# Patient Record
Sex: Male | Born: 1950 | Race: White | Hispanic: No | Marital: Married | Smoking: Former smoker
Health system: Southern US, Academic
[De-identification: ages and names within clinical notes are randomized; demographics above are authoritative.]

## PROBLEM LIST (undated history)

## (undated) DIAGNOSIS — E785 Hyperlipidemia, unspecified: Secondary | ICD-10-CM

## (undated) DIAGNOSIS — M545 Low back pain, unspecified: Secondary | ICD-10-CM

## (undated) DIAGNOSIS — Z87898 Personal history of other specified conditions: Secondary | ICD-10-CM

## (undated) DIAGNOSIS — R202 Paresthesia of skin: Secondary | ICD-10-CM

## (undated) DIAGNOSIS — I10 Essential (primary) hypertension: Secondary | ICD-10-CM

## (undated) DIAGNOSIS — E119 Type 2 diabetes mellitus without complications: Secondary | ICD-10-CM

## (undated) HISTORY — DX: Paresthesia of skin: R20.2

## (undated) HISTORY — DX: Personal history of other specified conditions: Z87.898

## (undated) HISTORY — DX: Essential (primary) hypertension: I10

## (undated) HISTORY — DX: Low back pain, unspecified: M54.50

## (undated) HISTORY — DX: Hyperlipidemia, unspecified: E78.5

## (undated) HISTORY — PX: HX EYE SURGERY: 2100001143

## (undated) HISTORY — PX: HX HAND SURGERY: 2100001299

## (undated) HISTORY — DX: Type 2 diabetes mellitus without complications (CMS HCC): E11.9

## (undated) HISTORY — PX: HX WRIST FRACTURE TX: SHX121

## (undated) HISTORY — PX: EYE SURGERY: SHX253

## (undated) HISTORY — PX: HAND SURGERY: SHX662

## (undated) NOTE — ED Notes (Signed)
Formatting of this note might be different from the original.  Pt updated on Xray results, pending blood results, awaiting urine sample.  Electronically signed by Sidonie Dickens, RN at 05/17/2017  1:51 PM EDT

## (undated) NOTE — ED Notes (Signed)
Formatting of this note might be different from the original.  Bed: 17H  Expected date:   Expected time:   Means of arrival:   Comments:  Triage  Electronically signed by Ocie Bob, RN at 05/17/2017 12:06 PM EDT

## (undated) NOTE — ED Provider Notes (Signed)
Formatting of this note is different from the original.  This patient was presented to Dr. Evelene Croon by Dr. Mayford Knife.    Chief Complaint   Patient presents with   ? Fatigue     "slept for 24 hours"; endorses worsening weakness x 2 weeks, recently treated for right ear infection on 2nd abx, dizzy     HPI   Louis Harper is a 39 y.o. 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 Louis Harper reports 2 weeks of dizziness, weakness, and fatigue. He describes the dizziness as the feeling "when you stand up too quickly." Dizziness is constant and nothing makes it better or worse. It is unchanged with positional changes. He has endorsed associated decreased PO intake. He denies recent falls, nausea, emesis, diarrhea, one sided weakness, or vision changes. Patient reports history of vertigo many years ago which was improved with a manipulation, however this has not been an issue since. Wife also reports Louis Harper required admission for paroxysmal atrial flutter at an OSH in [place] in November 2018 at which time he spent a night in the ICU.     Patient was seen OSH recently for right otitis media ~2 weeks ago for which he was placed on amoxicillin. He developed 2 days of fever (Tmax 101-102F, last fever 3/23) and was seen again at a [facility], where he was switched to Augmentin with concern for sinusitis (been on since 3/20). He reports some right sided phonophobia but denies cough.     Louis Harper metformin was recently increased in the first week of March but he denies other medication changes. He denies use of alcohol, drugs, or any smoking since 2010. NKDA.    Review of Systems   Constitutional: Positive for appetite change (decreased), fatigue and fever (resolved).   HENT:        Positive for R phonophobia   Eyes: Negative for visual disturbance.   Respiratory: Negative for cough.    Gastrointestinal: Negative for  diarrhea, nausea and vomiting.   Neurological: Positive for dizziness and weakness.   All other systems reviewed and are negative.    History  Past Medical History:   Diagnosis Date   ? Club foot    ? DM (diabetes mellitus)    ? HLD (hyperlipidemia)    ? HTN (hypertension)    ? Old MI (myocardial infarction)    ? Unspecified atrial flutter      Past Surgical History:   Procedure Laterality Date   ? CARDIAC CATHETERIZATION     ? WRIST SURGERY Right      No family history on file.  Social History     Socioeconomic History   ? Marital status: Married     Spouse name: Not on file   ? Number of children: Not on file   ? Years of education: Not on file   ? Highest education level: Not on file   Occupational History   ? Not on file   Social Needs   ? Financial resource strain: Not on file   ? Food insecurity:     Worry: Not on file     Inability: Not on file   ? Transportation needs:     Medical: Not on file     Non-medical: Not on file   Tobacco Use   ? Smoking status: Not on file   Substance and Sexual Activity   ?  Alcohol use: Not on file   ? Drug use: Not on file   ? Sexual activity: Not on file   Lifestyle   ? Physical activity:     Days per week: Not on file     Minutes per session: Not on file   ? Stress: Not on file   Relationships   ? Social connections:     Talks on phone: Not on file     Gets together: Not on file     Attends religious service: Not on file     Active member of club or organization: Not on file     Attends meetings of clubs or organizations: Not on file     Relationship status: Not on file   ? Intimate partner violence:     Fear of current or ex partner: Not on file     Emotionally abused: Not on file     Physically abused: Not on file     Forced sexual activity: Not on file   Other Topics Concern   ? Not on file   Social History Narrative   ? Not on file     Prior to Admission medications    Not on File     No Known Allergies    Physical Exam   Vitals:    05/17/17 1213 05/17/17 1513   BP: (!)  145/68 133/71   Pulse: 85 73   Resp: 16 18   Temp: 36.4 C (97.5 F)    TempSrc: Oral    SpO2: 99% 97%     Nursing notes and vital signs reviewed    General: Patient in no acute distress, overall appears comfortable  Head: Normocephalic, atraumatic  Eyes: No scleral icterus, PERRL, EOMI  Ears/Nose/Throat: Moist mucous membranes, airway is patent, no facial swelling, TM visualized clear, w/o evidence of infection.  Cardiovascular: Regular rate and rhythm with no murmurs/rubs appreciated, 2+ radial pulses bilaterally, no LE swelling, no JVD appreciated  Pulmonary: Normal respiratory effort, lungs w/o wheezes/crackles  GI: Abdomen is obese but soft, non-distended, and non-tender. Peritoneal signs are absent.   Back: No gross deformities.  Musculoskeletal: No gross injuries or deformities.   Neurological: Alert and oriented x3, face symmetric, hearing intact to voice, speech normal, bilateral hand tremors, no facial asymmetry, normal v1-v3 sensation bilaterally, no nystagmus, tongue midline, equal shoulder shrug, difficulty with finger to nose, no dysmetria, positive romberg  Psych: Behaving appropriately   Skin: Warm, dry, no obvious rashes    Procedures    MDM    Adult ECG Report on May 17, 2017 at 12:02:49     Rate: 94   PR Interval: 161   QRS Duration: 93   QTc: 452   QRS Axis: 268   Narrative Interpretation: No STEMI. Q's II, III, F    Labs Reviewed   COMPREHENSIVE METABOLIC PANEL - Abnormal; Notable for the following components:       Result Value    Glucose 131 (*)     est GFR 56 (*)     All other components within normal limits   URINALYSIS WITH REFLEX MICROSCOPIC - Abnormal; Notable for the following components:    Glucose UA 1+ (*)     Cast Urine Few (*)     All other components within normal limits   POCT GLUCOSE - Abnormal; Notable for the following components:    Poc Glucose 140 (*)     All other components within normal limits   MAGNESIUM -  Normal   TROPONIN I - Normal   CBC AND PLT     X-Ray Chest 2  views   Final Result   IMPRESSION:     No acute cardiopulmonary process.     Arlington Calix,  M.D.   [facility], Attending Physician       Medications   sodium chloride 0.9% IV bolus 500 mL (0 mL Intravenous Completed 05/17/17 1415)     ED Course:     ED Course as of May 18 1606   Wed May 17, 2017   1305 Concern for dehydration, electrolyte abnormality, ACS, vertigo.  VSS at presentation and remained so throughout ED course.  EKG NSR with q waves in inferior leads.    POCT glucose 140  [CW]   1400 CXR: No acute cardiopulmonary process  [CW]   1400 WBC 9  glucose 131  Magnesium 2.2  Troponin <0.02  UA negative  [CW]   1506 Patient safe for discharge, weakness fatigue may be attributed to possible dehydration, stress of ill family member.  Patient instructed to follow up with PCP.  Return precautions given.  [CW]     ED Course User Index  [CW] Bufford Lope, MD     Attestations:    I have personally interviewed and examined the patient.  I have reviewed the resident's history, physical exam, assessment and management plans.  I concur with or have edited all elements of the resident's note.  Myles Rosenthal, MD, May 17, 2017, 16:22    The documentation recorded by the scribe accurately reflects the service I personally performed and the decisions made by me.   Bufford Lope, MD, May 17, 2017, 14:39    Dr. Mayford Knife obtained and performed the history, physical exam and medical decision making elements that were entered into the chart by me, Mayford Knife, scribe [phone]).  May 17, 2017  12:55    EKG was entered into the chart by me, Bryon Lions, scribe [phone]).  May 17, 2017  13:32    Electronically Signed By:      Bufford Lope, MD  Resident  05/17/17 1608      Myles Rosenthal, MD  05/17/17 1622    Electronically signed by Myles Rosenthal, MD at 05/17/2017  4:22 PM EDT

## (undated) NOTE — ED Notes (Signed)
Formatting of this note might be different from the original.  Pt reassessed, agree with prior assessment. Pt able to walk to bathroom with minimal assistance. Updated on course of care. Will continue to monitor.  Electronically signed by Sidonie Dickens, RN at 05/17/2017  1:35 PM EDT

---
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
## 2000-11-07 ENCOUNTER — Other Ambulatory Visit (HOSPITAL_COMMUNITY): Payer: Self-pay

## 2014-01-21 ENCOUNTER — Encounter (HOSPITAL_COMMUNITY): Payer: Self-pay

## 2014-01-21 NOTE — Ancillary Notes (Addendum)
[facility] Record for: Louis Harper, [name]  Created: 01/21/2014 10:22:45 AM  MRN: [id number]  DOB: 26-Jan-1951  SSN: [id number]  Sex: Male  Height: 6 Feet 0 Inches - Weight: 242  Maiden Name:   Address:   [address]  [place]: [place], [place] [id number]  E-Mail:   Day Phone: [phone] Night Phone: [phone] Other Phone: [phone]  Phone comments:   Call Back time:   Authorized contact: Wynema BirchBillie Howeth-wife  Intake Date: 01/21/2014 10:22:45 AM  PCP: Lelon PerlaSaunders FNP-BC, Deanna ArtisKeisha   RefMD: Lacy DuverneySaunders FNP-BC, Deanna ArtisKeisha   Primary Insurance: Medicare  Secondary Insurance: BCBS PPO  Insurance Comments: [phone]---card on file     -------Referral Assignment------  Where was the original referral directed?: Physician Practice  Was a specific reviewer requested?: Unassigned Referral  How was the reviewer selected?: Rotation  Who requested the specific reviewer?:   How did you hear of our spine program?:   Is this a second opinion?:      -------Web Challenge Info----------  Favorite Color:   City of Birth:      ---------- 1st Review ----------     Review Date: 02/08/2014  Review Completed by: Neta EhlersScott Daffner  Impression: Low Back Pain  Disposition: Appointment/Treatment with Colleague  Appt w/ Colleague: First Available Appointment  Colleague Name: Non Surgical Provider  Appt How Soon:   Pre Treatment Type: Physical Therapy; Facet Injection  Pre Treatment Type Details:   Pre Treatment Other Test:   Appt Type:   Instructions: Back pain. Has facet arthrosis multiple levels. No obvious stenosis. Start PT, use NSAIDs as tolerated, can f/u with nonop provider. Might benefit from facet blocks/ablation. Looks like he[place] from [place] so may also be able to find nonop provider closer to home. Avoid narcotics     ---------- Symptoms ----------     Chief complaint: low back painno/hip/buttock/leg/groin pain  Diagnosis from Other MD:      Symptoms: Pain  Other Symptom Description:   Pain Location: Low back  Other Pain Location Description:    Where is your pain the worst?:   Pain Type: Sharp/Stabbing,Intermittent  Pain Rating: 10  Does the pain radiate to other parts of your body? No   Radiate Where:   Other Description:   Does it radiate to the fingers?   Does it radiate below the elbow?   Which specific part of your arm?   Which fingers?   Which part of your arm does pain go to?   How does it radiate to the arm?   Does it radiate to the toes?   Does it radiate below the knee?   Which specific part of your leg?   Which toes?   Which part of your leg does pain go to?   How does it radiate to the leg?   Additional pain information When back is at      Location of Numbness/tingling:   Other Description:  Does numbness/tingling radiate to other parts of your body?:  Where does the numbess/tingling radiate?:  Other Description:  Does the numbness/tingling radiate below your elbow?:  Which specific part of your arm?:  Which fingers:  Which pat of your arm does the numbness/tingling go to?:  How does the numbness/tingling radiate to your arm?:  Does the numbness/tingling radiate below your knee?:  Which specific part of your leg?:  Which toes:  Which part of your leg does the numbness/tingling go to?:  How does the numbness/tingling radiate to your leg?:  Additional Numbness/tingling information:  Other Description:      Location of Weakness:   Other Description:   Additional Weakness Information:      The symptoms have been present for: more than 1 year  The symptoms began: Hurt his back over 20 years ago --- on and off pain since then---getting worse  Was there a specific event that caused your symptoms?: As a result of an injury at work  Additional Narrative Description:   Are you able to perform your daily activities with these symptoms?:   Since what date have you been unable to perform your daily routine?:   The symptoms improve when you: Never improve  Other activities that improve your symptoms:   The symptoms worsen when you: Activity  Other  activities that worsen your symptoms:      ---------- Work History ----------     Are you able to work?: Does not apply  Reasons for not working: Retired  Other Reason for not working:   Do you have Work Restrictions:   If applicable, maximum lifting restriction:   How long have you been unable to work:   Have you ever filed a W/C claim related to a neck or back injury?: yes - back  Occupation:   Other Occupation Information:      ---------- Bowel/Bladder/Incontinence Issues ----------     Since the onset of symptoms, have you experienced any new problems urinating or having bowel movements?: No  Description:   Other Description:   How long have you had these bowel/bladder problems?:      ---------- Allergies ----------     Do you have any medication allergies? NKA  Allergies:   Other Details:   Allergic to Latex?: No  Allergic to intravenous contrast dyes?: NKA  Allergic to steroids?: No     ---------- Treatment/Testing ----------      Taking prescription medication for this problem?: Yes  Are you using any now?: Yes  Medication: couldn't remember what he takes; MD Name: ; Date Prescribed: ; Dosage: ; Times/Day:   Have you received a Medrol dose pack for this problem?: No  When did you last take the dosepack?:   Were your symptoms improved?:   Would you describe your relief as:   How long did you experience that amount of relief?:   Other Medrol dosepack information:      --------------- PT ---------------     PT: No  When Received:   Where Received:   Other where received information:   Visits:   Types:   Other Types:   Improved:      If improved, describe level of relief:   If improved, how long did you experience relief:   Other Physical Therapy Treatment Information:      ---------- Chiropractic Services ----------     Chiro: No  When:   Who:   Visits:   Types:   Other Types:   Were Symptoms Improved:   If improved, describe level of relief:   If improved, how long did you experience relief:   Other  Chiropractic Treatment Information:      --------------- ESI ---------------     ESI: No  When:   Who:   Visits:   Types:   Improved:   If improved, describe level of relief:   If improved, how long did you experience relief:   Other Injection Treatment Information:      ---------- Diagnostic Tests ----------     Plain spine X-rays On:11-22-13 Where: [facility]  Area(s) Scanned: Lumbar  CT scan On:01-15-14 Where: [facility]  Area Scanned: Lumbar  Do you have any of the following in case an MRI is ordered?: None  Other MRI factors:      ---------- Past Medical History ----------     What other doctors/providers have treated you for these spine issues? Lacy DuverneySaunders FNP-BC, DentonKeisha Specialty: Primary Care Date: 11/15    Ever diagnosed with Spine deformity?:   Prior neck or back surgery (1)?: No  When:   Who:   Area of neck/spine operated on:   Level:   Were symptoms improved?:   If improved, describe level of relief:  If improved, how long did you experience relief?:   Prior neck or back surgery (2)?:   When:   Who:   Area of neck/spine operated on:   Level:   Were symptoms improved?:   If improved, describe level of relief:  If improved, how long did you experience relief?:   Prior neck or back surgery (3)?:   When:   Who:   Area of neck/spine operated on:   Level:   Were symptoms improved?:   If improved, describe level of relief:  If improved, how long did you experience relief?:   Do you have any of the following assistive devices? None  How long have you required these assistive devices?   Currently being treated for any other medical condition?: Yes  Conditions: Couldn't remember will bring list  Other Conditions:   Type of neurologic disorder:   Cancer Type:   Other Treatment/Medication: Will bring list  What blood thinners are you currently taking?: None  Which physicians are treating you for your other medical conditions?: Lelon PerlaSaunders FNP-BC,Keisha  Do you smoke?: No  Are you pre-menopausal or  post-menopausal?:   If recommended, are you willing to consider surgery?:   What is your goal in seeking treatment?:   Other pertinent information/ general comments/ goals for treatment: no hx cano other studies     ---------- Care Coordinator Information ----------     Care Coordinator:   Activity Log: 02/10/2014 9:28:46 AM Misty Stanley[Lisa Herod]: Phone call to patient. Reviewed patient's medical history.  Patient states no change in symptoms.  Discussed MD initial impression and recommendations to start PT, use NSAIDs as tolerated, can f/u with nonop provider and avoid narcotics.  Provided education on facet arthrosis multiple levels and no obvious stenosis. Patient chose to accept recommendations.  Instructed to follow-up with PCP/referring MD for therapy script due to insurance restrictions.  Instructed Referral Specialist will call to schedule appointment/tests.  Explained that a letter will be faxed to the PCP/referring physician regarding this conversation. Patient verbalized understanding. Marcelina MorelLisa Herod, RN     Letter/Test Coordinator: Letters  Letter/Test Coordinator Log: 02/10/2014 9:53:54 AM Elnita Maxwell[Cheryl Chidester]: Faxed treatment letter patient summary and physical therapy script to Andi DevonKeisha Saunders FNP-BC. CChidester. 02/10/2014 10:29:09 AM Rosey Bath[Teresa Benson]: Called and scheduled appointment for patient to see Dr. Liliane BadeSully on 03/27/13 at [facility]1pm. TBenson     Intake Specialist Comments:      Clinic Staff Comments:      Last Edited by: Marney DoctorLori Manu Rubey on 05/06/2014 10:52:58 AM  Last Review by: Neta EhlersScott Daffner on 02/08/2014

## 2014-03-27 ENCOUNTER — Ambulatory Visit (INDEPENDENT_AMBULATORY_CARE_PROVIDER_SITE_OTHER): Payer: Self-pay | Admitting: Orthopaedics-Spine Service Non-Operative

## 2014-04-03 ENCOUNTER — Ambulatory Visit (INDEPENDENT_AMBULATORY_CARE_PROVIDER_SITE_OTHER): Payer: Self-pay | Admitting: Orthopaedics-Spine Service Non-Operative

## 2014-05-06 ENCOUNTER — Ambulatory Visit
Payer: Medicare Other | Attending: Orthopaedics-Spine Service Non-Operative | Admitting: Orthopaedics-Spine Service Non-Operative

## 2014-05-06 ENCOUNTER — Ambulatory Visit (HOSPITAL_BASED_OUTPATIENT_CLINIC_OR_DEPARTMENT_OTHER): Payer: Medicare Other

## 2014-05-06 ENCOUNTER — Encounter (INDEPENDENT_AMBULATORY_CARE_PROVIDER_SITE_OTHER): Payer: Self-pay | Admitting: Orthopaedics-Spine Service Non-Operative

## 2014-05-06 VITALS — BP 146/83 | HR 84 | Temp 96.2°F | Ht 72.0 in | Wt 256.4 lb

## 2014-05-06 DIAGNOSIS — M545 Low back pain, unspecified: Secondary | ICD-10-CM

## 2014-05-06 DIAGNOSIS — Z87891 Personal history of nicotine dependence: Secondary | ICD-10-CM | POA: Insufficient documentation

## 2014-05-06 DIAGNOSIS — M47896 Other spondylosis, lumbar region: Secondary | ICD-10-CM

## 2014-05-06 DIAGNOSIS — M129 Arthropathy, unspecified: Secondary | ICD-10-CM

## 2014-05-06 DIAGNOSIS — M5136 Other intervertebral disc degeneration, lumbar region: Secondary | ICD-10-CM

## 2014-05-06 MED ORDER — DICLOFENAC SODIUM 75 MG TABLET,DELAYED RELEASE
75.0000 mg | DELAYED_RELEASE_TABLET | Freq: Every day | ORAL | Status: DC
Start: 2014-05-06 — End: 2014-05-26

## 2014-05-06 NOTE — Progress Notes (Signed)
[facility]        Patient Name:  Louis Harper   Date of Birth:  07-15-1950   MRN#:  [id number]  Date of Service:  05/06/2014         WUJ:WJXBJY Saunders, FNP  [facility] [address]  [place] [address]  Referring:Saunders, Deanna Artis, FNP  [facility]  [address]  [place], [place] [address] , thank you for this request for consultation.    Chief Complaint   Patient presents with    Back Pain       History of Present Illness: Louis Harper  is a 64 y.o. male who presents with a complaint of LBP.      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    Pain is located at low back without radiation .   The pain is described as achy in character, 3/10 in severity most of the time but increases to 10/10 several times a week  Exacerbated with lifting, walking, .  Alleviated with hot water, tens unit.    There is no associated numbness or tingling  There is no weakness.  There is no new bowel/bladder symptoms    Prior Treatments:  -Physical Therapy? Did several years ago  -Prior injections? none  -Medications? Hydrocodone 5/325mg  one pill only      Work: retired in 1995    Past Medical History   Diagnosis Date    Low back pain            Past Surgical History   Procedure Laterality Date    Hx eye surgery       right "lazy eye"    Hx hand surgery       right    Hx wrist fracture tx       right           Outpatient Prescriptions Marked as Taking for the 05/06/14 encounter (Office Visit) with Brenton Grills, MD   Medication Sig    allopurinol (ZYLOPRIM) 100 mg Oral Tablet Take 100 mg by mouth Once a day    diclofenac sodium (VOLTAREN) 75 mg Oral Tablet, Delayed Release (E.C.) Take 1 Tab (75 mg total) by mouth Once a day    FOLIC ACID/MULTIVIT-MIN/LUTEIN (CENTRUM SILVER ORAL) Take by mouth    hydrochlorothiazide (HYDRODIURIL) 25 mg Oral Tablet Take 25 mg by mouth Once a day    lisinopril (PRINIVIL) 10 mg Oral Tablet Take 10 mg by mouth Once a day     metFORMIN (GLUCOPHAGE) 500 mg Oral Tablet Take 500 mg by mouth Twice daily with food    omeprazole (PRILOSEC) 20 mg Oral Capsule, Delayed Release(E.C.) Take 20 mg by mouth Twice daily    pravastatin (PRAVACHOL) 40 mg Oral Tablet Take 40 mg by mouth Every evening       Allergy History as of 05/06/14      No Known Allergies                No family history on file.        Otherwise, PMH, PSH, and family history for Spine is negative.  History     Social History    Marital Status: Married     Spouse Name: N/A     Number of Children: N/A    Years of Education: N/A     Occupational History    Not on file.  Social History Main Topics    Smoking status: Former Smoker     Quit date: 05/05/2008    Smokeless tobacco: Not on file      Comment: hx 2-3 PPD x 40 years    Alcohol Use: No      Comment: quit 2010    Drug Use: No    Sexual Activity: Not on file     Other Topics Concern    Not on file     Social History Narrative    No narrative on file             REVIEW OF SYSTEMS:   Except as noted above, pt denies fevers, chills, night sweats, unintentional weight loss or gain, incoordination, falling, GI bleeds or saddle anesthesia. Review is otherwise as per the HPI.     PHYSICAL EXAMINATION    Filed Vitals:    05/06/14 1039   BP: 146/83   Pulse: 84   Temp: 35.7 C (96.2 F)   Height: 1.829 m (6')   Weight: 116.3 kg (256 lb 6.3 oz)     Body mass index is 34.77 kg/(m^2).  General Appearance:Well developed, well nourished and in no acute distress, alert and oriented x3 and cooperative for evaluation  Psychiatric: Mood and affect are normal.  Answers questions appropriately.  Respiratory: Breathing comfortably, no accessory muscle use.  Cardiovascular:  2+ distal pulses in lower limbs.  No peripheral edema or cyanosis.  Lymphatics  No adenopathy in the extremities.  Dermatologic: Skin is intact and without notable lesions.  HEENT: Normocephalic, atraumatic, anicteric. Mucous membranes moist.  Extraocular movements  are intact.  Abdomen:  Soft, nontender.      T/L/S SPINE & LOWER EXTREMITY EXAM  Bilateral lower extremities:    Full ROM of all joints without crepitus, locking, effusions, warmth or instability. No calf tenderness.  Strength: 5/5 in the Bilat LE.      Neurologic:   Sensation: intact in lower limbs.   Reflexes: 2+ and symmetric in Bilat LE and there are no upper motor neuron signs.    Gait: Romberg testing is negative.  Gait is pain-free and stable in tandem and reciprocal patterns.     Spine:   Lumbosacral range of motion:  is to 90 degrees flexion, 10 degrees extension, and 30 degrees rotation and lateral bending with pain at end-range with flexion>extension.     Tenderness with palpation: L5 paraspinals and associated facets and spinous processes. There is no tenderness at sacral sulci, greater trochanters, piriformis, or ischial tuberosities.     Special Tests:  Straight-leg raise, Slump test are negative bilaterally.    Images and report reviewed:  XR 11/2013:  Mild to mod deg changes  CT 12/2013:  Early hypertrophic facet changes of lower facet joints with mild endplate changes and mild disc bulges noted.           IMPRESSION:  1. LBP after accident at work in 1995 which resulted in fractured vertebrae, now with axial pain     There are no signs of upper tract signs or bowel/bladder dysfunction or significant weight loss    PLAN:  Diagnostic Studies: Flex/ex XR today  Medications: Rx diclofenac  Therapy: Rx PT. Begin PT 1-2 sessions/wk for 4-6 weeks to focus on gentle stretching with progressive strengthening, directional preference and core stability.  Focus on occupational and functional goals and incorporate home exercise program. Limited modalities.  Interventions: none  Education: Reviewed benefits and side effects of medications.  Discussed role  and goals of PT.   Follow-up: Return to clinic in 2 weeks-- pt will have started PT.

## 2014-05-26 ENCOUNTER — Ambulatory Visit
Payer: Medicare Other | Attending: Orthopaedics-Spine Service Non-Operative | Admitting: Orthopaedics-Spine Service Non-Operative

## 2014-05-26 DIAGNOSIS — Z87891 Personal history of nicotine dependence: Secondary | ICD-10-CM | POA: Insufficient documentation

## 2014-05-26 DIAGNOSIS — M47816 Spondylosis without myelopathy or radiculopathy, lumbar region: Secondary | ICD-10-CM

## 2014-05-26 DIAGNOSIS — Z8781 Personal history of (healed) traumatic fracture: Secondary | ICD-10-CM | POA: Insufficient documentation

## 2014-05-26 DIAGNOSIS — M545 Low back pain: Secondary | ICD-10-CM | POA: Insufficient documentation

## 2014-05-26 MED ORDER — MELOXICAM 15 MG TABLET
15.0000 mg | ORAL_TABLET | Freq: Every day | ORAL | Status: DC
Start: 2014-05-26 — End: 2023-06-28

## 2014-05-26 NOTE — Progress Notes (Signed)
[facility]        Patient Name:  Louis Harper   Date of Birth:  [birth year]   MRN#:  [id number]  Date of Service:  05/26/2014         WUJ:WJXBJY Saunders, FNP  [facility] [address]  [place] [address]  Referring:Saunders, Deanna Artis, FNP  [facility]  [address]  [place], [place] [address] , thank you for this request for consultation.    Chief Complaint   Patient presents with    Back Pain     follow up        History of Present Illness: Louis Harper  is a 64 y.o. male who presents with a complaint of LBP.      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    Pain is located at low back without radiation .   The pain is described as achy in character, 3/10 in severity most of the time but increases to 10/10 several times a week  Exacerbated with lifting, walking, .  Alleviated with hot water, tens unit.    There is no associated numbness or tingling  There is no weakness.  There is no new bowel/bladder symptoms    Prior Treatments:  -Physical Therapy? Did several years ago  -Prior injections? none  -Medications? Hydrocodone 5/325mg  one pill only      Work: retired in 1995    INTERVAL VISIT  05/26/14:  Been to 2 sessions of PT and unsure if it's helpful.  Taking diclofenac which isn't helpful.  All pain is in low back.  Doesn't feel it coming down his legs.  Is interested in an injection.  Pain stil 10/10.    Past Medical History   Diagnosis Date    Low back pain            Past Surgical History   Procedure Laterality Date    Hx eye surgery       right "lazy eye"    Hx hand surgery       right    Hx wrist fracture tx       right           Outpatient Prescriptions Marked as Taking for the 05/26/14 encounter (Office Visit) with Brenton Grills, MD   Medication Sig    allopurinol (ZYLOPRIM) 100 mg Oral Tablet Take 100 mg by mouth Once a day    diclofenac sodium (VOLTAREN) 75 mg Oral Tablet, Delayed Release (E.C.) Take 1 Tab (75 mg total) by mouth  Once a day    FOLIC ACID/MULTIVIT-MIN/LUTEIN (CENTRUM SILVER ORAL) Take by mouth    hydrochlorothiazide (HYDRODIURIL) 25 mg Oral Tablet Take 25 mg by mouth Once a day    HYDROcodone-acetaminophen (NORCO) 5-325 mg Oral Tablet Take 1 Tab by mouth Every 4 hours as needed for Pain    lisinopril (PRINIVIL) 10 mg Oral Tablet Take 10 mg by mouth Once a day    metFORMIN (GLUCOPHAGE) 500 mg Oral Tablet Take 500 mg by mouth Twice daily with food    omeprazole (PRILOSEC) 20 mg Oral Capsule, Delayed Release(E.C.) Take 20 mg by mouth Twice daily    pravastatin (PRAVACHOL) 40 mg Oral Tablet Take 40 mg by mouth Every evening       Allergy History as of 05/26/14      No Known Allergies  No family history on file.        Otherwise, PMH, PSH, and family history for Spine is negative.  History     Social History    Marital Status: Married     Spouse Name: N/A    Number of Children: N/A    Years of Education: N/A     Occupational History    Not on file.     Social History Main Topics    Smoking status: Former Smoker     Quit date: 05/05/2008    Smokeless tobacco: Not on file      Comment: hx 2-3 PPD x 40 years    Alcohol Use: No      Comment: quit 2010    Drug Use: No    Sexual Activity: Not on file     Other Topics Concern    Not on file     Social History Narrative    No narrative on file             REVIEW OF SYSTEMS:   Except as noted above, pt denies fevers, chills, night sweats, unintentional weight loss or gain, incoordination, falling, GI bleeds or saddle anesthesia. Review is otherwise as per the HPI.     PHYSICAL EXAMINATION    There were no vitals filed for this visit.  There is no weight on file to calculate BMI.  General Appearance:Well developed, well nourished and in no acute distress, alert and oriented x3 and cooperative for evaluation  Psychiatric: Mood and affect are normal.  Answers questions appropriately.  Respiratory: Breathing comfortably, no accessory muscle use.  Cardiovascular:   2+ distal pulses in lower limbs.  No peripheral edema or cyanosis.  Lymphatics  No adenopathy in the extremities.  Dermatologic: Skin is intact and without notable lesions.  HEENT: Normocephalic, atraumatic, anicteric. Mucous membranes moist.  Extraocular movements are intact.  Abdomen:  Soft, nontender.      T/L/S SPINE & LOWER EXTREMITY EXAM  Bilateral lower extremities:    Full ROM of all joints without crepitus, locking, effusions, warmth or instability. No calf tenderness.  Strength: 5/5 in the Bilat LE.      Neurologic:   Sensation: intact in lower limbs.   Reflexes: 2+ and symmetric in Bilat LE and there are no upper motor neuron signs.    Gait: Romberg testing is negative.  Gait is pain-free and stable in tandem and reciprocal patterns.     Spine:   Lumbosacral range of motion:  is to 90 degrees flexion, 10 degrees extension, and 30 degrees rotation and lateral bending with pain at end-range with extension>flexion.     Tenderness with palpation: L5 paraspinals and associated facets and spinous processes. There is no tenderness at sacral sulci, greater trochanters, piriformis, or ischial tuberosities.     Special Tests:  Straight-leg raise, Slump test are negative bilaterally.    Images and report reviewed:  XR 11/2013:  Mild to mod deg changes  CT 12/2013:  Early hypertrophic facet changes of lower facet joints with mild endplate changes and mild disc bulges noted.           IMPRESSION:  1. LBP after accident at work in 1995 which resulted in fractured vertebrae, now with axial pain     There are no signs of upper tract signs or bowel/bladder dysfunction or significant weight loss    PLAN:  Diagnostic Studies: reviewed final read of Flex/ex XR today  Medications: D/c diclofenac.  Rx Mobic today  Therapy: Continue PT  Interventions: Referred for MBB at Pain clinic  Education: Reviewed benefits and side effects of medications.  Discussed role and goals of PT.   Follow-up: prn

## 2014-05-26 NOTE — Patient Instructions (Signed)
Stop Diclofenac  Start Mobic

## 2014-06-09 ENCOUNTER — Ambulatory Visit (HOSPITAL_BASED_OUTPATIENT_CLINIC_OR_DEPARTMENT_OTHER)
Admission: RE | Admit: 2014-06-09 | Discharge: 2014-06-09 | Disposition: A | Discharge status: Home / Self Care | Payer: Medicare Other | Source: Ambulatory Visit

## 2014-06-09 ENCOUNTER — Encounter (HOSPITAL_BASED_OUTPATIENT_CLINIC_OR_DEPARTMENT_OTHER): Payer: Self-pay | Admitting: Anesthesiology

## 2014-06-09 ENCOUNTER — Ambulatory Visit
Admission: RE | Admit: 2014-06-09 | Discharge: 2014-06-09 | Disposition: A | Discharge status: Home / Self Care | Payer: Medicare Other | Source: Ambulatory Visit | Attending: Anesthesiology | Admitting: Anesthesiology

## 2014-06-09 VITALS — BP 140/80 | HR 62 | Temp 98.6°F | Resp 20 | Ht 72.0 in | Wt 263.4 lb

## 2014-06-09 DIAGNOSIS — M47817 Spondylosis without myelopathy or radiculopathy, lumbosacral region: Secondary | ICD-10-CM

## 2014-06-09 DIAGNOSIS — M48061 Spinal stenosis, lumbar region without neurogenic claudication: Secondary | ICD-10-CM | POA: Insufficient documentation

## 2014-06-09 DIAGNOSIS — M4806 Spinal stenosis, lumbar region: Secondary | ICD-10-CM

## 2014-06-09 DIAGNOSIS — M545 Low back pain: Secondary | ICD-10-CM | POA: Insufficient documentation

## 2014-06-09 DIAGNOSIS — M47816 Spondylosis without myelopathy or radiculopathy, lumbar region: Secondary | ICD-10-CM

## 2014-06-09 NOTE — Cancer Center Note (Signed)
[facility]   Pain Oncology H&P    Date of Service:  06/09/2014  Louis Hammockaylor,Louis Harper, 64 y.o. male  Date of Admission:  06/09/2014  Date of Birth:  02/03/1951  PCP: Andi DevonKeisha Saunders, FNP    SUBJECTIVE:  Chief Complaint:  No chief complaint on file.    HPI: Louis Harper is a 64 y.o., White male who presents with Location -  back, Quality (character of pain)  sharp, aching, shooting, Severity (minimal, mild, severe, scale or 1-10) -  severe, Duration (how long has pain/sx present) - years, Timing (when does pain/sx occur) - daily, Context (activity at/before onset) - working , Modifying factors (what makes pain/sx better/worse) - standing walking worse, Associated sign/sx (what accompanies main pain/sx) - radiates across back  Past pain treatments include nothing.    PAST MEDICAL:    Past Medical History   Diagnosis Date    Low back pain         Past Surgical History   Procedure Laterality Date    Hx eye surgery       right "lazy eye"    Hx hand surgery       right    Hx wrist fracture tx       right             Cannot display prior to admission medications because the patient has not been admitted in this contact.        No Known Allergies    ROS: Other than ROS in the HPI, all other review of systems were negative except for: Cardiovascular: positive for chest pain  Behvioral/Psych: positive for depression    Examination:  Temperature: 37 C (98.6 F) Heart Rate: 62 BP (Non-Invasive): 140/80 mmHg   Respiratory Rate: 20   Pain Score (Numeric, Faces): 5     General: appears in good health  Eyes: Conjunctiva clear.  HENT:Head atraumatic and normocephalic  Neck: No JVD  Lungs: Thoracic excursion  Normal  Cardiovascular: regular rate and rhythm  Abdomen: Soft, non-tender  Genito-urinary: Deferred  Extremities: extremities normal, atraumatic, no cyanosis or edema  Skin: Skin warm and dry  Neurologic: Alert and oriented X 3, normal strength and tone. Normal symmetric reflexes. Normal coordination and  gait  Lymphatics: No lymphadenopathy  Psychiatric: Normal affect, behavior, memory, thought content, judgement, and speech.    Labs  No results found for this or any previous visit (from the past 36 hour(s)).      Assessment and Plan:  Patient Active Problem List   Diagnosis    Low back pain       The patient verbalized understanding of the above plan and the patient wishes to move forward with the above noted plan.  Left lumbar medial branch blocks, two levels, L4-5 and L5-S1    Right lumbar medial branch block, two levels L4-5 and L5-S1      Rodman Compichard Zaelyn Barbary, MD 06/09/2014, 13:39

## 2014-07-09 ENCOUNTER — Ambulatory Visit: Payer: Medicare Other | Attending: Anesthesiology | Admitting: Anesthesiology

## 2014-07-09 VITALS — Wt 259.7 lb

## 2014-07-09 DIAGNOSIS — M47816 Spondylosis without myelopathy or radiculopathy, lumbar region: Secondary | ICD-10-CM | POA: Insufficient documentation

## 2014-07-09 DIAGNOSIS — M545 Low back pain, unspecified: Secondary | ICD-10-CM

## 2014-07-09 NOTE — Procedures (Signed)
[facility]  [address]  [address]  [place] [id number]  [phone]          PATIENT NAME: Louis Harper  CHART BJYNWG:[id number]:[id number]  DATE OF BIRTH: 02/18/1951  DATE OF SERVICE:07/09/2014    SITE OF SERVICE  [facility].    PREOPERATIVE DIAGNOSIS:    Patient Active Problem List   Diagnosis    Low back pain    Spondylosis of lumbosacral region without myelopathy or radiculopathy    Spinal stenosis, lumbar region, without neurogenic claudication          POSTOPERATIVE DIAGNOSIS:  Same      NAME OF PROCEDURE:  Right  medial branch blocks at 2 levels in the lumbar area with fluoroscopic guidance.     SURGEON:  Rodman Compichard Jalyiah Shelley, MD    ANESTHESIA: Local    ESTIMATED BLOOD LOSS:None    COMPLICATIONS:  None    DESCRIPTION OF PROCEDURE: After informed consent was obtained, the patient was placed in the prone position.  The back was prepped with Hibiclens and draped with a sterile drape.  Fluoroscopic guidance was used.  The junctions of the transverse processes at the levels of L4  and L5 and Sacral Ala with the vertebral body were identified. The skin was infiltrated with 1% Xylocaine at each level, and 22-gauge, 3.5-inch needles were placed under fluoroscopic visualization.  2cc local anesthetic at each level of a mixture of 4cc Dexamethasone and 2cc 0.5% Bupivacaine were injected at each level.  The patient tolerated the procedure well.  The patient will follow up in clinic, with possible radiofrequency ablation in the future.    Rodman Compichard Yanelie Abraha, MD 07/09/2014, 14:35  Assistant Professor  Department of Neurosurgery  Division of Pain Medicine

## 2014-07-09 NOTE — Patient Instructions (Signed)
[facility]  Pain Management Services                                           SPECIAL PROCEDURES                                          DISCHARGE FORM                                               [phone]      Please follow the instructions listed below for your procedures.  If you have questions concerning your procedure, you may call and leave a message.  Messages will be returned by the end of the next business day.  If you have an emergency, proceed to your local Emergency Department.      PROCEDURE: LUMBAR MEDIAL BRANCH BLOCK    1.  Do not drive a car or operate machinery today.  2.  It is normal to feel numb at the site of injection for several hours.  3.  Rest today without lifting or pulling activities.  4.  Return to normal activities as directed by your referring physician tomorrow.    These instructions have been reviewed with the patient and appropriate questions have answers.  Lawan Nanez, LPN

## 2014-07-24 ENCOUNTER — Ambulatory Visit: Payer: Medicare Other | Attending: Anesthesiology | Admitting: Physician Assistant

## 2014-07-24 ENCOUNTER — Encounter (INDEPENDENT_AMBULATORY_CARE_PROVIDER_SITE_OTHER): Payer: Self-pay | Admitting: Physician Assistant

## 2014-07-24 VITALS — BP 151/83 | HR 70 | Temp 97.0°F | Resp 20 | Wt 260.1 lb

## 2014-07-24 DIAGNOSIS — M48061 Spinal stenosis, lumbar region without neurogenic claudication: Secondary | ICD-10-CM

## 2014-07-24 DIAGNOSIS — M47817 Spondylosis without myelopathy or radiculopathy, lumbosacral region: Secondary | ICD-10-CM | POA: Insufficient documentation

## 2014-07-24 DIAGNOSIS — M4806 Spinal stenosis, lumbar region: Secondary | ICD-10-CM | POA: Insufficient documentation

## 2014-07-24 DIAGNOSIS — M545 Low back pain, unspecified: Secondary | ICD-10-CM

## 2014-07-24 NOTE — Progress Notes (Addendum)
S- This is a 64 y.o. year old male with history of chronic low back and no leg pain, per his description. The patient was diagnosed having lumbar spinal stenosis, lumbar spondylosis and lumbar intervertebral disc disease. He underwent a Right lumbar medial branch block, two levels L4-5 and L5-S1 2 weeks ago. He claims 0% relief to date. The patient denies any myelopathy. The patient denies any complications on the injection site. The patient denies any bladder or bowel changes. The patient denies any fevers.  O- On examination, He  is no apparent distress. BP 151/83 mmHg   Pulse 70   Temp(Src) 36.1 C (97 F)   Resp 20   Wt 118 kg (260 lb 2.3 oz)   SpO2 96%. Injection site is intact, no ereythema, swelling, drainage, induration, warmth or signs of infection. Skin is warm and pink. Gait is steady, no ataxia. Motor and sensory funtions are intact. DTR's are intact and symmetric. Cranial Nerve 2-12 are intact to specific testings. SLR is negative bilaterally.   Assessment:     ICD-10-CM    1. Low back pain M54.5    2. Spinal stenosis, lumbar region, without neurogenic claudication M48.06    3. Spondylosis of lumbosacral region without myelopathy or radiculopathy M47.817        Plan: 1.L5-S1 LEsI. RTC to evlauate.  2. Walking exercises as tolerated  3. PT as needed    The patient was seen independently.  Arloa KohRolando Garcia, PA-C 07/24/2014, 13:53  [facility]

## 2014-08-07 ENCOUNTER — Ambulatory Visit: Payer: Medicare Other | Attending: Anesthesiology | Admitting: Anesthesiology

## 2014-08-07 DIAGNOSIS — M545 Low back pain, unspecified: Secondary | ICD-10-CM

## 2014-08-07 DIAGNOSIS — M4806 Spinal stenosis, lumbar region: Secondary | ICD-10-CM | POA: Insufficient documentation

## 2014-08-07 DIAGNOSIS — M47817 Spondylosis without myelopathy or radiculopathy, lumbosacral region: Secondary | ICD-10-CM | POA: Insufficient documentation

## 2014-08-07 DIAGNOSIS — M48061 Spinal stenosis, lumbar region without neurogenic claudication: Secondary | ICD-10-CM

## 2014-08-07 NOTE — Patient Instructions (Signed)
[facility]  Pain Management Services                                           SPECIAL PROCEDURES                                     DISCHARGE FORM                                          [phone]      Please follow the instructions listed below for your procedures.  If you have questions concerning your procedure, you may call and leave a message.  Messages will be returned by the end of the next business day.  If you have an emergency, proceed to your local Emergency Department.      PROCEDURE: LUMBAR EPIDURAL STEROID INJECTION    1.  Do not drive a car or operate machinery until tomorrow.  2.  Rest today and return to normal activities tomorrow.  3.  If you are on restricted activities by your physician, please continue to follow these.  If you are not sure, contact your physician.  4.  It is possible to experience mild numbness of the lower back and legs.  This is                  temporary.  5   If you have soreness at the injection site, the application of heat or ice may be helpful. Mild analgesics may also be used.  6.  In case of severe headache; lie flat to decrease it.  Increase all fluids especially those with caffeine.  Mild analgesics are also appropriate.  If headache is not relieved by these measures, contact the Pain Clinic.  7.  Steroid injections may cause temporary increase of blood sugar levels.    These instructions have been reviewed with the patient and appropriate questions have answers.  Tiffinie Caillier, LPN

## 2014-08-07 NOTE — Procedures (Signed)
[facility]  [address]  [address]  [place] [address]  [phone]        PATIENT NAME: Louis Harper  CHART QUIVHO:[id number]  DATE OF BIRTH: Aug 16, 1950  DATE OF SERVICE:08/07/2014    SITE OF SERVICE  [facility].    PREOPERATIVE DIAGNOSIS:   Patient Active Problem List   Diagnosis    Low back pain    Spondylosis of lumbosacral region without myelopathy or radiculopathy    Spinal stenosis, lumbar region, without neurogenic claudication            POSTOPERATIVE DIAGNOSIS: Same      NAME OF PROCEDURE: Lumbar Epidural steroid injection with fluoroscopy.  .     SURGEON: Rodman Comp, MD        ANESTHESIA: Local        ESTIMATED BLOOD LOSS:None      COMPLICATIONS:  None      DESCRIPTION OF PROCEDURE:  After informed consent was obtained, the patient was taken to the procedure room and placed in the prone position.  The back was prepped with Hibiclens and draped with a sterile drape.  The L5-S1 interspace was identified.  Local infiltration with 1% lidocaine was performed.  Loss of resistance was used to confirm the epidural space.  No blood or CSF was noted.  Fluoroscopic guidance was used.  6 mL volume injected  including 4cc Dexamethasone and 2cc of 0.2% Saline. The patient tolerated the procedure well.  The patient will follow up in clinic.    Rodman Comp, MD 08/07/2014, 13:42  Assistant Professor  Department of Neurosurgery  Division of Pain Medicine  See  note for details. I saw and evaluated the patient and agree with the resident's findings and plan as written except as noted and I was present and supervised all procedures performed.    Rodman Comp MD  08/07/2014, 13:43

## 2014-08-27 ENCOUNTER — Ambulatory Visit (INDEPENDENT_AMBULATORY_CARE_PROVIDER_SITE_OTHER): Payer: Medicare Other | Admitting: Physician Assistant

## 2014-08-29 ENCOUNTER — Ambulatory Visit (INDEPENDENT_AMBULATORY_CARE_PROVIDER_SITE_OTHER): Payer: Self-pay | Admitting: Orthopaedics-Spine Service Non-Operative

## 2014-08-29 NOTE — Telephone Encounter (Signed)
Fax[facility] [place] with directions to obtain refills from PCP as Dr. Liliane BadeSully is no longer at [facility].

## 2015-08-12 ENCOUNTER — Encounter: Payer: Self-pay | Admitting: Emergency Medicine

## 2015-08-12 ENCOUNTER — Other Ambulatory Visit: Payer: Self-pay

## 2015-08-12 ENCOUNTER — Emergency Department: Payer: No Typology Code available for payment source

## 2015-08-12 ENCOUNTER — Emergency Department
Admission: EM | Admit: 2015-08-12 | Discharge: 2015-08-12 | Disposition: A | Discharge status: Home / Self Care | Payer: No Typology Code available for payment source | Attending: Emergency Medicine | Admitting: Emergency Medicine

## 2015-08-12 DIAGNOSIS — R0602 Shortness of breath: Secondary | ICD-10-CM | POA: Insufficient documentation

## 2015-08-12 DIAGNOSIS — S39012A Strain of muscle, fascia and tendon of lower back, initial encounter: Secondary | ICD-10-CM | POA: Diagnosis not present

## 2015-08-12 DIAGNOSIS — W2210XA Striking against or struck by unspecified automobile airbag, initial encounter: Secondary | ICD-10-CM

## 2015-08-12 DIAGNOSIS — Y999 Unspecified external cause status: Secondary | ICD-10-CM | POA: Diagnosis not present

## 2015-08-12 DIAGNOSIS — Y9389 Activity, other specified: Secondary | ICD-10-CM | POA: Insufficient documentation

## 2015-08-12 DIAGNOSIS — Y92410 Unspecified street and highway as the place of occurrence of the external cause: Secondary | ICD-10-CM | POA: Insufficient documentation

## 2015-08-12 DIAGNOSIS — IMO0001 Reserved for inherently not codable concepts without codable children: Secondary | ICD-10-CM

## 2015-08-12 DIAGNOSIS — S3992XA Unspecified injury of lower back, initial encounter: Secondary | ICD-10-CM | POA: Diagnosis present

## 2015-08-12 MED ORDER — NAPROXEN 500 MG PO TABS: 500.0000 mg | ORAL_TABLET | Freq: Two times a day (BID) | ORAL | Status: AC

## 2015-08-12 MED ORDER — BACLOFEN 10 MG PO TABS: 5.0000 mg | ORAL_TABLET | Freq: Three times a day (TID) | ORAL | Status: AC

## 2015-08-12 MED ORDER — IPRATROPIUM-ALBUTEROL 0.5-2.5 (3) MG/3ML IN SOLN
3.0000 mL | Freq: Once | RESPIRATORY_TRACT | Status: AC
  Administered 2015-08-12: 3 mL via RESPIRATORY_TRACT
  Filled 2015-08-12: qty 3
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
## 2015-08-23 NOTE — ED Provider Notes (Signed)
EKG from 08/12/2015 reviewed and interpreted by myself Dr. Shaune PollackLord M.D.  105 bpm. Sinus tachycardia. Left axis deviation. Q waves inferiorly.  Governor Rooksebecca Romen Yutzy, MD 08/23/15 [phone]
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
## 2021-12-21 ENCOUNTER — Other Ambulatory Visit (HOSPITAL_COMMUNITY): Payer: Self-pay | Admitting: Family

## 2021-12-21 DIAGNOSIS — K760 Fatty (change of) liver, not elsewhere classified: Secondary | ICD-10-CM

## 2022-03-02 ENCOUNTER — Ambulatory Visit (HOSPITAL_COMMUNITY): Payer: Self-pay

## 2022-05-11 ENCOUNTER — Other Ambulatory Visit (HOSPITAL_COMMUNITY): Payer: Medicare PPO

## 2022-05-11 ENCOUNTER — Other Ambulatory Visit (HOSPITAL_COMMUNITY): Payer: Self-pay | Admitting: NURSE PRACTITIONER

## 2022-05-11 ENCOUNTER — Inpatient Hospital Stay (HOSPITAL_COMMUNITY)
Admission: RE | Admit: 2022-05-11 | Discharge: 2022-05-11 | Disposition: A | Discharge status: Home / Self Care | Payer: Medicare PPO | Source: Ambulatory Visit | Attending: NURSE PRACTITIONER | Admitting: NURSE PRACTITIONER

## 2022-05-11 ENCOUNTER — Other Ambulatory Visit: Payer: Self-pay

## 2022-05-11 ENCOUNTER — Inpatient Hospital Stay
Admission: RE | Admit: 2022-05-11 | Discharge: 2022-05-11 | Disposition: A | Discharge status: Home / Self Care | Payer: Medicare PPO | Source: Ambulatory Visit | Attending: Family | Admitting: Family

## 2022-05-11 DIAGNOSIS — K760 Fatty (change of) liver, not elsewhere classified: Secondary | ICD-10-CM | POA: Insufficient documentation

## 2022-05-11 DIAGNOSIS — M79601 Pain in right arm: Secondary | ICD-10-CM

## 2022-05-11 DIAGNOSIS — R935 Abnormal findings on diagnostic imaging of other abdominal regions, including retroperitoneum: Secondary | ICD-10-CM

## 2022-05-11 LAB — HEPATIC FUNCTION PANEL
ALBUMIN/GLOBULIN RATIO: 1.6 — ABNORMAL HIGH (ref 0.8–1.4)
ALBUMIN: 4 g/dL (ref 3.5–5.7)
ALKALINE PHOSPHATASE: 56 U/L (ref 34–104)
ALT (SGPT): 21 U/L (ref 7–52)
AST (SGOT): 19 U/L (ref 13–39)
BILIRUBIN DIRECT: 0.05 md/dL (ref <=0.20)
BILIRUBIN TOTAL: 0.4 mg/dL (ref 0.3–1.2)
BILIRUBIN, INDIRECT: 0.35 mg/dL (ref <=1)
GLOBULIN: 2.5 — ABNORMAL LOW (ref 2.9–5.4)
PROTEIN TOTAL: 6.5 g/dL (ref 6.4–8.9)

## 2022-05-11 LAB — CBC
HCT: 40.2 % (ref 36.7–47.1)
HGB: 13.2 g/dL (ref 12.5–16.3)
MCH: 28 pg (ref 23.8–33.4)
MCHC: 32.8 g/dL (ref 32.5–36.3)
MCV: 85.3 fL (ref 73.0–96.2)
MPV: 8.6 fL (ref 7.4–11.4)
PLATELETS: 164 10*3/uL (ref 140–440)
RBC: 4.71 10*6/uL (ref 4.06–5.63)
RDW: 17.3 % — ABNORMAL HIGH (ref 12.1–16.2)
WBC: 6.9 10*3/uL (ref 3.6–10.2)

## 2022-05-12 LAB — ALPHA FETOPROTEIN (AFP) TUMOR MARKER: AFP TUMOR MARKER: <2 ng/mL (ref <=9)

## 2022-07-04 ENCOUNTER — Other Ambulatory Visit: Payer: Self-pay

## 2022-07-04 ENCOUNTER — Other Ambulatory Visit: Payer: Medicare PPO | Attending: PSYCHIATRY AND NEUROLOGY-NEUROLOGY

## 2022-07-04 DIAGNOSIS — M79601 Pain in right arm: Secondary | ICD-10-CM

## 2022-07-04 LAB — FOLATE: FOLATE: >23.8 ng/mL (ref 5.9–24.4)

## 2022-07-04 LAB — VITAMIN B12: VITAMIN B 12: 1002 pg/mL — ABNORMAL HIGH (ref 180–914)

## 2022-07-04 LAB — THYROID STIMULATING HORMONE (SENSITIVE TSH): TSH: 1.974 u[IU]/mL (ref 0.450–5.330)

## 2022-07-04 LAB — RHEUMATOID FACTOR, SERUM: RHEUMATOID FACTOR: <13 IU/mL (ref <=30)

## 2022-07-04 LAB — URIC ACID: URIC ACID: 5.9 mg/dL (ref 2.3–7.6)

## 2022-07-05 LAB — HEP-2 SUBSTRATE ANTINUCLEAR ANTIBODIES (ANA), SERUM: ANA INTERPRETATION: NEGATIVE

## 2022-07-25 ENCOUNTER — Other Ambulatory Visit: Payer: Self-pay

## 2022-07-25 ENCOUNTER — Inpatient Hospital Stay
Admission: RE | Admit: 2022-07-25 | Discharge: 2022-07-25 | Disposition: A | Discharge status: Home / Self Care | Payer: Medicare PPO | Source: Ambulatory Visit | Attending: PSYCHIATRY AND NEUROLOGY-NEUROLOGY | Admitting: PSYCHIATRY AND NEUROLOGY-NEUROLOGY

## 2022-07-25 ENCOUNTER — Other Ambulatory Visit (HOSPITAL_COMMUNITY): Payer: Self-pay | Admitting: PSYCHIATRY AND NEUROLOGY-NEUROLOGY

## 2022-07-25 DIAGNOSIS — M542 Cervicalgia: Secondary | ICD-10-CM | POA: Insufficient documentation

## 2022-07-25 DIAGNOSIS — M79602 Pain in left arm: Secondary | ICD-10-CM

## 2022-07-25 DIAGNOSIS — M79601 Pain in right arm: Secondary | ICD-10-CM

## 2022-07-26 ENCOUNTER — Other Ambulatory Visit (HOSPITAL_COMMUNITY): Payer: Self-pay | Admitting: PSYCHIATRY AND NEUROLOGY-NEUROLOGY

## 2022-07-26 ENCOUNTER — Encounter (HOSPITAL_COMMUNITY): Payer: Self-pay | Admitting: PSYCHIATRY AND NEUROLOGY-NEUROLOGY

## 2022-07-26 DIAGNOSIS — R937 Abnormal findings on diagnostic imaging of other parts of musculoskeletal system: Secondary | ICD-10-CM

## 2022-08-15 ENCOUNTER — Other Ambulatory Visit: Payer: Self-pay

## 2022-08-15 ENCOUNTER — Inpatient Hospital Stay
Admission: RE | Admit: 2022-08-15 | Discharge: 2022-08-15 | Disposition: A | Discharge status: Home / Self Care | Payer: Medicare PPO | Source: Ambulatory Visit | Attending: PSYCHIATRY AND NEUROLOGY-NEUROLOGY | Admitting: PSYCHIATRY AND NEUROLOGY-NEUROLOGY

## 2022-08-15 DIAGNOSIS — R937 Abnormal findings on diagnostic imaging of other parts of musculoskeletal system: Secondary | ICD-10-CM | POA: Insufficient documentation

## 2022-09-27 ENCOUNTER — Other Ambulatory Visit (HOSPITAL_COMMUNITY): Payer: Self-pay | Admitting: Family

## 2022-09-27 DIAGNOSIS — K760 Fatty (change of) liver, not elsewhere classified: Secondary | ICD-10-CM

## 2022-11-21 ENCOUNTER — Other Ambulatory Visit (HOSPITAL_COMMUNITY): Payer: Self-pay | Admitting: NURSE PRACTITIONER

## 2022-11-21 ENCOUNTER — Inpatient Hospital Stay
Admission: RE | Admit: 2022-11-21 | Discharge: 2022-11-21 | Disposition: A | Discharge status: Home / Self Care | Payer: Medicare PPO | Source: Ambulatory Visit | Attending: NURSE PRACTITIONER | Admitting: NURSE PRACTITIONER

## 2022-11-21 ENCOUNTER — Other Ambulatory Visit: Payer: Self-pay

## 2022-11-21 DIAGNOSIS — M545 Low back pain, unspecified: Secondary | ICD-10-CM

## 2022-11-23 ENCOUNTER — Inpatient Hospital Stay
Admission: RE | Admit: 2022-11-23 | Discharge: 2022-11-23 | Disposition: A | Discharge status: Home / Self Care | Payer: Medicare PPO | Source: Ambulatory Visit | Attending: Family | Admitting: Family

## 2022-11-23 ENCOUNTER — Other Ambulatory Visit (HOSPITAL_COMMUNITY): Payer: Medicare PPO

## 2022-11-23 ENCOUNTER — Other Ambulatory Visit: Payer: Self-pay

## 2022-11-23 DIAGNOSIS — R935 Abnormal findings on diagnostic imaging of other abdominal regions, including retroperitoneum: Secondary | ICD-10-CM

## 2022-11-23 DIAGNOSIS — K760 Fatty (change of) liver, not elsewhere classified: Secondary | ICD-10-CM | POA: Insufficient documentation

## 2022-11-23 LAB — HEPATIC FUNCTION PANEL
ALBUMIN/GLOBULIN RATIO: 1.6 — ABNORMAL HIGH (ref 0.8–1.4)
ALBUMIN: 4 g/dL (ref 3.5–5.7)
ALKALINE PHOSPHATASE: 65 U/L (ref 34–104)
ALT (SGPT): 19 U/L (ref 7–52)
AST (SGOT): 19 U/L (ref 13–39)
BILIRUBIN DIRECT: 0.2 md/dL — ABNORMAL HIGH (ref 0.03–0.18)
BILIRUBIN TOTAL: 0.6 mg/dL (ref 0.3–1.0)
BILIRUBIN, INDIRECT: 0.4 mg/dL (ref <=1)
GLOBULIN: 2.5 — ABNORMAL LOW (ref 2.9–5.4)
PROTEIN TOTAL: 6.5 g/dL (ref 6.4–8.9)

## 2022-11-23 LAB — CBC
HCT: 42.9 % (ref 36.7–47.1)
HGB: 13.8 g/dL (ref 12.5–16.3)
MCH: 26.9 pg (ref 23.8–33.4)
MCHC: 32.3 g/dL — ABNORMAL LOW (ref 32.5–36.3)
MCV: 83.3 fL (ref 73.0–96.2)
MPV: 8.5 fL (ref 7.4–11.4)
PLATELETS: 154 10*3/uL (ref 140–440)
RBC: 5.15 10*6/uL (ref 4.06–5.63)
RDW: 18.7 % — ABNORMAL HIGH (ref 12.1–16.2)
WBC: 5.7 10*3/uL (ref 3.6–10.2)

## 2022-11-24 LAB — ALPHA FETOPROTEIN (AFP) TUMOR MARKER: AFP TUMOR MARKER: <2 ng/mL (ref <=9)

## 2022-12-05 ENCOUNTER — Encounter (INDEPENDENT_AMBULATORY_CARE_PROVIDER_SITE_OTHER): Payer: Self-pay | Admitting: NEUROLOGY

## 2022-12-05 DIAGNOSIS — R202 Paresthesia of skin: Secondary | ICD-10-CM | POA: Insufficient documentation

## 2022-12-08 ENCOUNTER — Ambulatory Visit (INDEPENDENT_AMBULATORY_CARE_PROVIDER_SITE_OTHER): Payer: Self-pay | Admitting: NEUROLOGY
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
## 2023-05-19 ENCOUNTER — Ambulatory Visit
Admission: RE | Admit: 2023-05-19 | Discharge: 2023-05-19 | Disposition: A | Discharge status: Home / Self Care | Source: Ambulatory Visit

## 2023-05-19 ENCOUNTER — Other Ambulatory Visit: Payer: Self-pay

## 2023-05-19 ENCOUNTER — Other Ambulatory Visit (HOSPITAL_COMMUNITY): Payer: Self-pay

## 2023-05-19 DIAGNOSIS — M25511 Pain in right shoulder: Secondary | ICD-10-CM

## 2023-06-16 ENCOUNTER — Ambulatory Visit (INDEPENDENT_AMBULATORY_CARE_PROVIDER_SITE_OTHER)

## 2023-06-16 ENCOUNTER — Encounter (INDEPENDENT_AMBULATORY_CARE_PROVIDER_SITE_OTHER): Payer: Self-pay

## 2023-06-28 ENCOUNTER — Other Ambulatory Visit: Payer: Self-pay

## 2023-06-28 ENCOUNTER — Ambulatory Visit (INDEPENDENT_AMBULATORY_CARE_PROVIDER_SITE_OTHER): Payer: Self-pay

## 2023-06-28 ENCOUNTER — Encounter (INDEPENDENT_AMBULATORY_CARE_PROVIDER_SITE_OTHER): Payer: Self-pay

## 2023-06-28 ENCOUNTER — Ambulatory Visit (INDEPENDENT_AMBULATORY_CARE_PROVIDER_SITE_OTHER)

## 2023-06-28 VITALS — BP 102/70 | HR 72 | Ht 72.0 in | Wt 226.0 lb

## 2023-06-28 DIAGNOSIS — I10 Essential (primary) hypertension: Secondary | ICD-10-CM

## 2023-06-28 DIAGNOSIS — D631 Anemia in chronic kidney disease: Secondary | ICD-10-CM

## 2023-06-28 DIAGNOSIS — Z794 Long term (current) use of insulin: Secondary | ICD-10-CM

## 2023-06-28 DIAGNOSIS — Z7985 Long-term (current) use of injectable non-insulin antidiabetic drugs: Secondary | ICD-10-CM

## 2023-06-28 DIAGNOSIS — E119 Type 2 diabetes mellitus without complications: Secondary | ICD-10-CM

## 2023-06-28 DIAGNOSIS — N183 Chronic kidney disease, stage 3 unspecified: Secondary | ICD-10-CM

## 2023-06-28 DIAGNOSIS — E1122 Type 2 diabetes mellitus with diabetic chronic kidney disease: Secondary | ICD-10-CM

## 2023-06-28 DIAGNOSIS — N189 Chronic kidney disease, unspecified: Secondary | ICD-10-CM

## 2023-06-28 DIAGNOSIS — E559 Vitamin D deficiency, unspecified: Secondary | ICD-10-CM

## 2023-06-28 DIAGNOSIS — I129 Hypertensive chronic kidney disease with stage 1 through stage 4 chronic kidney disease, or unspecified chronic kidney disease: Secondary | ICD-10-CM

## 2023-06-28 NOTE — Progress Notes (Unsigned)
 NEPHROLOGY, [facility]  [address] [place] [address]    Progress Note    Name: Louis Harper MRN:  [id number]   Date: 06/28/2023 DOB:  Nov 21, 1950 (73 y.o.)              Nephrology Out  Patient Visit         HPI : 73 y.o.  male here to establish care.Chronic kidney disease:  Creatinine 1.54 GFR 47 stage IIIA.  , past medical history chronic kidney disease, .  Hyperlipidemia hypertension, Diastolic heart failure, non-insulin-dependent diabetes mellitus type II,.   He also has a  history of NSAID use was on Mobic . He is not longer taking it . Denies current NSAID use. Education provided regarding the side effects and kidney disease. Louis Harper  He has no issues with edema.  Edema noted today. Blood pressure is 100-110s .   Family history:  No known family history kidney disease social history: Denies history alcohol tobacco illicit drug use.  Lab work from other lab:  June 07, 2023 creatinine 1.54 GFR 47 sodium 141 potassium 4.2 carbon dioxide 22 calcium 9.3 hemoglobin 15.9 iron saturation 23 uric acid 4.9 March 16, 2023 creatinine 1.55 GFR 47 December 14, 2022 creatinine 1.88 GFR 37      ROS:     Assessment was negative except what mentioned in in the HPI.     OBJECTIVE:   BP 102/70   Pulse 72   Ht 1.829 m (6')   Wt 103 kg (226 lb)   BMI 30.65 kg/m       General:  NAD, AAOx3  HEENT:  EOMI,  no icterus  NECK: No increased JVD.  Normal inspection   HEART:   Regular rhythm. No murmurs or rubs. No chest wall tenderness   LUNGS: Clear to auscultation bilateral. No wheezes, rales, or rhonchi.   ABDOMEN: +BS, Soft, nontender and nondistended. No rebound or guarding present.   EXTREMITIES: No edema. No cyanosis or clubbing.No asterixis    NEURO : Normal speech, EOMI.       LABORATORY DATA:   Lab Results   Component Value Date    ALBUMIN 4.0 11/23/2022    ALBUMIN 4.0 05/11/2022    HGB 13.8 11/23/2022    HGB 13.2 05/11/2022    HCT 42.9 11/23/2022    HCT 40.2 05/11/2022           MEDICATIONS:  Outpatient  Medications Marked as Taking for the 06/28/23 encounter (Office Visit) with [facility], NP   Medication Sig    allopurinol (ZYLOPRIM) 100 mg Oral Tablet Take 1 Tablet (100 mg total) by mouth Daily    atorvastatin (LIPITOR) 80 mg Oral Tablet Take 1 Tablet (80 mg total) by mouth Every evening    buPROPion (WELLBUTRIN XL) 150 mg extended release 24 hr tablet Take 1 Tablet (150 mg total) by mouth Daily    famotidine (PEPCID) 40 mg Oral Tablet Take 1 Tablet (40 mg total) by mouth Every evening    gabapentin (NEURONTIN) 300 mg Oral Capsule Take 1 Capsule (300 mg total) by mouth Three times a day    hydroCHLOROthiazide (HYDRODIURIL) 12.5 mg Oral Tablet Take 1 Tablet (12.5 mg total) by mouth Once a day    MOUNJARO 10 mg/0.5 mL Subcutaneous Pen Injector Inject 0.5 mL (10 mg total) under the skin Every 7 days    omeprazole (PRILOSEC) 40 mg Oral Capsule, Delayed Release(E.C.) Take 1 Capsule (40 mg total) by mouth Daily  TRESIBA FLEXTOUCH U-100 100 unit/mL (3 mL) Subcutaneous Insulin Pen Inject 10 Units under the skin Once a day     Current Outpatient Medications   Medication Instructions    allopurinoL (ZYLOPRIM) 100 mg, Daily    atorvastatin (LIPITOR) 80 mg, EVERY EVENING    buPROPion (WELLBUTRIN XL) 150 mg, Daily    famotidine (PEPCID) 40 mg, EVERY EVENING    gabapentin (NEURONTIN) 300 mg, 3 TIMES DAILY    hydroCHLOROthiazide (HYDRODIURIL) 12.5 mg, DAILY    Mounjaro 10 mg, EVERY 7 DAYS    omeprazole (PRILOSEC) 40 mg, Daily    traZODone (DESYREL) 25 mg, Oral, NIGHTLY PRN    Tresiba FlexTouch U-100 10 Units, DAILY      ASSESSMENT / PLAN:  ENCOUNTER DIAGNOSES     ICD-10-CM   1. CKD (chronic kidney disease)  N18.9   2. Hypertension, unspecified type  I10   3. DM (diabetes mellitus)  E11.9   4. Vitamin D deficiency  E55.9           Chronic kidney disease  -Stage III  -Due to diabetes and hypertension  -Creatinine is 1.54/ GFR  47   -Baseline creatinine no established   - obtain Total protein to creatinine ratio-  -CBC and  a basic metabolic panel  -Return to clinic in 3 months  -Continue low-sodium diet  -balanced Fluid intake 40-50 oz a day.    -Avoid NSAIDs.  Tylenol only for pain  - obtain Renal imaging  -ACEI or [facility]:    -Sodium Glucose Cotransporter-2 (SGLT2) Inhibitors: Y  N contraindicated    CKD bone mineral disease  No results found for: "INTACTPTH", "VITAMIND25", "PHOSPHORUS"     Hypertension  -Blood pressure is acceptable  -Goal less than 130/80  -Continue lisinopril  -Low-salt diet    Diabetes type 2  -A1c goal is less than 7 No results found for: "HA1C"   -Continue Mounjaro   -Diabetic diet  -Increase activity and exercise as possible  -Monitor A1C    Anemia of CKD  - Y N   - HB   Lab Results   Component Value Date    HGB 13.8 11/23/2022       - may need epo if HB less than 9.0  -will monitor Iron panel.     I have discussed with the patient the following issues:  The main goal of treatment is to prevent progression of CKD to complete kidney failure. The best way to do this is to control the underlying cause.  the optimal diet is one similar to the Dietary Approaches to Stop Hypertension (DASH) diet, consisting of fruits, vegetables, legumes, fish, poultry, and whole grains.  Restrict sodium intake to less than 2 g/day  Restrict fluids / free water intake to less then 1.5 L/day    Orders Placed This Encounter    BASIC METABOLIC PANEL    CBC/DIFF    MAGNESIUM    VITAMIN D 25 TOTAL    URIC ACID    PROTEIN/CREATININE RATIO, URINE, RANDOM    PARATHYROID HORMONE (PTH)    MICROALBUMIN/CREATININE RATIO, URINE, RANDOM       [facility], NP

## 2023-06-28 NOTE — Nursing Note (Signed)
 New patient for CKD 3B referred by Lacey Pian, FNP.

## 2023-07-03 ENCOUNTER — Other Ambulatory Visit: Payer: Self-pay

## 2023-07-03 ENCOUNTER — Emergency Department
Admission: EM | Admit: 2023-07-03 | Discharge: 2023-07-03 | Disposition: A | Discharge status: Home / Self Care | Source: Home / Self Care | Attending: NURSE PRACTITIONER | Admitting: NURSE PRACTITIONER

## 2023-07-03 ENCOUNTER — Emergency Department (HOSPITAL_COMMUNITY)

## 2023-07-03 DIAGNOSIS — R1310 Dysphagia, unspecified: Secondary | ICD-10-CM | POA: Insufficient documentation

## 2023-07-03 MED ORDER — DIPHENHYDRAMINE 50 MG/ML INJECTION SOLUTION
INTRAMUSCULAR | Status: AC
Start: 2023-07-03 — End: 2023-07-03
  Filled 2023-07-03: qty 1

## 2023-07-03 MED ORDER — DIPHENHYDRAMINE 50 MG/ML INJECTION SOLUTION
25.0000 mg | INTRAMUSCULAR | Status: AC
Start: 2023-07-03 — End: 2023-07-03
  Administered 2023-07-03: 25 mg via INTRAMUSCULAR

## 2023-07-03 NOTE — ED Triage Notes (Addendum)
 Throat closing up, unable to drink water since 1400 after taking antibiotic, worsening.

## 2023-07-03 NOTE — ED Nurses Note (Signed)
 Pt evaluated and treated by medical provider while in waiting room area. No primary RN assigned; therefore, no assessment performed. All paperwork given and questions answered. Pt acknowledged all understanding. Leaving waiting area.

## 2023-07-03 NOTE — Discharge Instructions (Signed)
 Today in the ED, we completed a workup for your complaint of trouble swallowing. The findings on your exam today and on your blood work and/or imaging is reassuring. Your workup did not show any emergent findings requiring hospitalization.    Sometimes, we do not always find the cause for your symptoms in one ER visit.  At this time, it is not 100% certain what is causing your symptoms, but we feel you can be discharged from the Emergency Department.     It is possible this may worsen or get better. Please, if you get worse or your symptoms change, return to ED. Otherwise, we strongly encourage you to follow up with primary care office within 24-48 hours or any of the specialists we have recommended.     Thank you for allowing us  to be part of your care.

## 2023-07-03 NOTE — ED Provider Notes (Signed)
 [facility]  Emergency Department  Advanced Practice Provider Note      CHIEF COMPLAINT  Chief Complaint   Patient presents with    Dysphagia     HISTORY OF PRESENT ILLNESS  Louis Harper, date of birth Apr 22, 1950, is a 73 y.o. male who presented to the Emergency Department.    Patient is a 73 year old male, with a history of diabetes, hypertension and hyperlipidemia, who presents to the ED with complaint of dysphagia.  Reports he was diagnosed this morning with URI and prescribed doxycycline.  States after taking doxycycline this afternoon he started having trouble swallowing.  Patient denies any lip or tongue swelling.  Denies any shortness of breath, wheezing, stridor or cough.  Patient denies that the pill feels stuck in his throat.  States he has been able to eat but is having difficulty drinking.      PAST MEDICAL/SURGICAL/FAMILY/SOCIAL HISTORY  Past Medical History:   Diagnosis Date    Diabetes mellitus, type 2     History of dizziness     HTN (hypertension)     Hyperlipidemia     Low back pain     Paresthesia of upper limb        Past Surgical History:   Procedure Laterality Date    HX EYE SURGERY      right "lazy eye"    HX HAND SURGERY      right    HX WRIST FRACTURE TX      right       Family Medical History:    None       Social History     Socioeconomic History    Marital status: Married   Tobacco Use    Smoking status: Former     Current packs/day: 0.00     Types: Cigarettes     Quit date: 05/05/2008     Years since quitting: 15.1    Tobacco comments:     hx 2-3 PPD x 40 years   Substance and Sexual Activity    Alcohol use: No     Alcohol/week: 0.0 standard drinks of alcohol     Comment: quit 2010    Drug use: No      ALLERGIES  No Known Allergies        PHYSICAL EXAM  VITAL SIGNS:  Filed Vitals:    07/03/23 2017 07/03/23 2208   BP: (!) 157/73    Pulse: 96    Resp: 20    Temp: 36.7 C (98 F)    SpO2: 95% 95%     Constitutional: Awake. Generally healthy appearing.  Average body weight.  No distress noted. Communicates appropriately   HEENT:   Head: Normocephalic and atraumatic.   Mouth/Throat: Oropharynx pink and moist. No postnasal drainage, erythema or exudates.   Eyes: EOMI, PERRL   Ears: External ear normal, no drainage noted; pearly gray TM, bilaterally. No postauricular tenderness, erythema, swelling or mass  Nose:External nose normal, no drainage  Neck: Trachea midline. Neck supple.   Cardiovascular: Regular rate. S1, S2 with no murmur or gallop heard.   Pulmonary/Chest: Breath sounds clear and equal bilaterally. No wheezes, rales or chest tenderness. No respiratory distress.     Musculoskeletal: No tenderness or deformity. Normal muscle tone and strength.   Skin: warm and dry. No rash, redness, or bruising  Psychiatric: normal mood and affect. Behavior is normal.   Neurological: Alert, oriented. Normal gait. No focal weakness noted. No sensory deficit  Nursing notes reviewed.       DIAGNOSTICS  Labs:  Labs listed below were reviewed and interpreted by me.  No results found for any visits on 07/03/23.  Radiology:  Results for orders placed or performed during the hospital encounter of 07/03/23   CT SOFT TISSUE NECK WO IV CONTRAST     Status: None    Narrative    Jencarlo G Lahue    RADIOLOGIST: Lanie Pitcairn    CT SOFT TISSUE NECK WO IV CONTRAST performed on 07/03/2023 8:37 PM    CLINICAL HISTORY: Dysphagia  Dysphagia    TECHNIQUE: CT imaging of the soft tissues of the neck from the orbits to the upper mediastinum without intravenous contrast.      COMPARISON: None.    FINDINGS:  Noncontrast neck CT is a limited exam.    Airway: Midline and patent.    Salivary glands: Unremarkable.    Lymph nodes:  No cervical lymphadenopathy.    Thyroid :  Unremarkable    Vasculature:  Unremarkable on this limited noncontrast exam.      Orbits:  Unremarkable at visualized levels.    Paranasal sinuses and mastoids: Grossly clear at visualized levels.    Lung apices:  Clear.  Upper mediastinum: Visualized  mediastinum is unremarkable.  Bones: There are advanced degenerative changes of the cervical spine. There is straightening with some reversal of the normal cervical lordosis. Endplate osteophytes are present throughout the spine.         Impression    No acute findings. If symptoms persist, further evaluation with direct visualization is recommended.        Radiologist location ID: [id number]           ED COURSE/MEDICAL DECISION MAKING          Medical Decision Making    Patient is a 73 year old male, with a history of diabetes, hypertension and hyperlipidemia, who presents to the ED with complaint of dysphagia.  Reports he was diagnosed this morning with URI and prescribed doxycycline.  States after taking doxycycline this afternoon he started having trouble swallowing.  Patient denies any lip or tongue swelling.  Denies any shortness of breath, wheezing, stridor or cough.  Patient denies that the pill feels stuck in his throat.  States he has been able to eat but is having difficulty drinking.    List of differential diagnosis includes but is not limited to anaphylaxis, stricture, esophagitis    Patient given Benadryl while in the ED. Patient able to swallow without difficulty. Patient able to eat and drink.  Strongly encouraged patient to call Dr. Basilio Both office in the morning to set up a follow up appointment.  Given strict return to ED precautions for any new or worsening symptoms.  Patient verbalizes understanding and agrees to this plan of care.    Risk  Prescription drug management.        CLINICAL IMPRESSION  Clinical Impression   Dysphagia, unspecified type (Primary)     DISPOSITION  Discharged       DISCHARGE MEDICATIONS  Current Discharge Medication List            Maxine Spare- FNP-C, ENP-C 07/03/2023, 22:02   [facility]  Department of Emergency Medicine  [place]  [place]    This note was partially generated using MModal Fluency Direct system, and there may be some incorrect  words, spellings, and punctuation that were not noted in checking the note before saving.    -----

## 2023-07-03 NOTE — ED Triage Notes (Signed)
 [facility] - Emergency Department  Emergency Department  Provider in Triage Note    Name: Louis Harper  Age: 73 y.o.  Gender: male     Subjective:   Louis Harper is a 73 y.o. male who presents with complaint of Dysphagia  .  73 year old male comes in with complaints of dysphagia that started this afternoon after taking a doxycycline.  Patient indicates he has unable to keep the saliva down.  Patient stated difficulty swallowing after eating a hamburger.  He denies any shortness of breath or chest pain    Objective:   Filed Vitals:    07/03/23 2017   BP: (!) 157/73   Pulse: 96   Resp: 20   Temp: 36.7 C (98 F)   SpO2: 95%          Assessment:  A medical screening exam was completed.  This patient is a 73 y.o. male with initial findings showing   Nursing notes reviewed for what could be assessed. Past Medical, Surgical, and Social history reviewed for what has been completed.     Constitutional: NAD. Well-Developed. Well Nourished.  Alert and oriented, hydrated, afebrile  Head: Normocephalic, atraumatic.  Mouth/Throat:  There is no erythema or exudate posterior pharynx.  Eyes: EOM grossly intact, conjunctiva normal.  Neck: Supple, Full range of motion  Cardiovascular: Regular Rate and Rhythm, extremities well perfused.  Pulmonary/Chest: No respiratory distress. Lungs are symmetric to auscultation bilaterally.  Abdominal: Soft, non-tender, non-distended. Non peritoneal, no rebound, no guarding.  MSK: No Lower Extremity Edema.  Skin: Warm, dry, and intact.  Neuro: Appropriate, CN II-XII grossly intact. Gait steady  Psych: Pleasant      Plan:  Please see initial orders and work-up below.  This is to be continued with full evaluation in the main Emergency Department.     No current facility-administered medications for this encounter.     No results found for this or any previous visit (from the past 24 hours).     Renell Carson, APRN  07/03/2023, 20:15

## 2023-09-04 ENCOUNTER — Encounter (INDEPENDENT_AMBULATORY_CARE_PROVIDER_SITE_OTHER): Payer: Self-pay | Admitting: NURSE PRACTITIONER

## 2023-09-04 ENCOUNTER — Ambulatory Visit: Payer: Self-pay | Attending: NURSE PRACTITIONER | Admitting: NURSE PRACTITIONER

## 2023-09-04 ENCOUNTER — Other Ambulatory Visit: Payer: Self-pay

## 2023-09-04 VITALS — Ht 72.0 in | Wt 227.0 lb

## 2023-09-04 DIAGNOSIS — R220 Localized swelling, mass and lump, head: Secondary | ICD-10-CM

## 2023-09-04 DIAGNOSIS — K1121 Acute sialoadenitis: Secondary | ICD-10-CM | POA: Insufficient documentation

## 2023-09-04 DIAGNOSIS — H938X1 Other specified disorders of right ear: Secondary | ICD-10-CM

## 2023-09-04 NOTE — H&P (Signed)
 ENT, [facility]  [address]  [place] [address]  Phone: [phone]  Fax: [phone]      Encounter Date: 09/04/2023    Patient ID: Louis Harper  MRN: [id number]    DOB: 1950/05/23  Age: 73 y.o. male         Referring Provider:    Donah Gravely, NP  [address]  [place],  [place] [address]    Reason for Visit:   Chief Complaint   Patient presents with    Ear Problem(s)     Patient complains of facial swelling and swelling R ear about 2 weeks.        History of Present Illness:  Louis Harper is a 73 y.o. male complain of right facial swelling and pain about 2 weeks ago.  Symptoms are resolving.  Some tenderness right cheek area at times.      Patient History:  Problem List[1]  Current Outpatient Medications   Medication Sig    allopurinol (ZYLOPRIM) 100 mg Oral Tablet Take 1 Tablet (100 mg total) by mouth Daily    atorvastatin (LIPITOR) 80 mg Oral Tablet Take 1 Tablet (80 mg total) by mouth Every evening    buPROPion (WELLBUTRIN XL) 150 mg extended release 24 hr tablet Take 1 Tablet (150 mg total) by mouth Daily    famotidine (PEPCID) 40 mg Oral Tablet Take 1 Tablet (40 mg total) by mouth Every evening    gabapentin (NEURONTIN) 300 mg Oral Capsule Take 1 Capsule (300 mg total) by mouth Three times a day    hydroCHLOROthiazide (HYDRODIURIL) 12.5 mg Oral Tablet Take 1 Tablet (12.5 mg total) by mouth Once a day    MOUNJARO 10 mg/0.5 mL Subcutaneous Pen Injector Inject 0.5 mL (10 mg total) under the skin Every 7 days    omeprazole (PRILOSEC) 40 mg Oral Capsule, Delayed Release(E.C.) Take 1 Capsule (40 mg total) by mouth Daily    traZODone (DESYREL) 50 mg Oral Tablet Take 0.5 Tablets (25 mg total) by mouth Every night as needed    TRESIBA FLEXTOUCH U-100 100 unit/mL (3 mL) Subcutaneous Insulin Pen Inject 10 Units under the skin Once a day     Allergies[2]  Past Medical History:   Diagnosis Date    Diabetes mellitus, type 2     History of dizziness     HTN (hypertension)     Hyperlipidemia     Low  back pain     Paresthesia of upper limb       Past Surgical History:   Procedure Laterality Date    HX EYE SURGERY      right lazy eye    HX HAND SURGERY      right    HX WRIST FRACTURE TX      right      Family Medical History:    None         Social History[3]    Review of Systems     Vitals:    09/04/23 0852   Weight: 103 kg (227 lb)   Height: 1.829 m (6')   BMI: 30.79      ENT Physical Exam  Constitutional  Appearance: patient appears well-developed, well-nourished and well-groomed,  Communication/Voice: communication appropriate for developmental age; vocal quality normal;  Head and Face  Appearance: head appears normal, face appears normal and face appears atraumatic;  Palpation: facial palpation normal;  Salivary: salivary tenderness noted; Salivary comments: right parotid tenderness, no swelling or asymmetry  Ear  Hearing: intact;  Auricles: right auricle normal; left auricle normal;  External Mastoids: right external mastoid normal; left external mastoid normal;  Ear Canals: right ear canal normal; left ear canal normal;  Tympanic Membranes: right tympanic membrane normal; left tympanic membrane normal;  Nose  External Nose: nares patent bilaterally; external nose normal;  Internal Nose: nasal mucosa normal; septum normal; bilateral inferior turbinates normal;  Oral Cavity/Oropharynx  Lips: normal;  Teeth: missing teeth and dentures noted;  Gums: gingiva normal;  Tongue: normal;  Oral mucosa: normal;  Hard palate: normal;  Soft palate: normal;  Tonsils: normal;  Base of Tongue: normal;  Posterior pharyngeal wall: normal;  Neck  Neck: neck normal; neck palpation normal;  Thyroid : thyroid  normal;  Respiratory  Inspection: breathing unlabored; normal breathing rate;  Lymphatic  Palpation: no cervical adenopathy noted;  Neurovestibular  Mental Status: alert and oriented;  Psychiatric: mood normal; affect is appropriate;  Cranial Nerves: cranial nerves intact;       Assessment:  ENCOUNTER DIAGNOSES      ICD-10-CM   1. Acute parotitis  K11.21       Plan:  Medical records reviewed on 09/04/2023.  Symptoms suggestive for acute parotitis that has resolved.  Gland tenderness should continue to improve, will monitor this    No orders of the defined types were placed in this encounter.    Return in about 1 month (around 10/05/2023).    Eleanor Blazer, FNP-BC  09/04/2023, 08:57        [1]   Patient Active Problem List  Diagnosis    Low back pain    Spondylosis of lumbosacral region without myelopathy or radiculopathy    Spinal stenosis, lumbar region, without neurogenic claudication    Paresthesia of upper limb   [2]   Allergies  Allergen Reactions    Amoxicillin-Pot Clavulanate Anaphylaxis    Doxycycline Angioedema   [3]   Social History  Tobacco Use    Smoking status: Former     Current packs/day: 0.00     Types: Cigarettes     Quit date: 05/05/2008     Years since quitting: 15.3    Tobacco comments:     hx 2-3 PPD x 40 years   Substance Use Topics    Alcohol use: No     Alcohol/week: 0.0 standard drinks of alcohol     Comment: quit 2010    Drug use: No

## 2023-09-21 ENCOUNTER — Other Ambulatory Visit (INDEPENDENT_AMBULATORY_CARE_PROVIDER_SITE_OTHER): Payer: Self-pay

## 2023-09-28 ENCOUNTER — Encounter (INDEPENDENT_AMBULATORY_CARE_PROVIDER_SITE_OTHER): Payer: Self-pay | Admitting: Nephrology

## 2023-10-05 ENCOUNTER — Other Ambulatory Visit: Payer: Self-pay

## 2023-10-05 ENCOUNTER — Ambulatory Visit: Payer: Self-pay | Attending: NURSE PRACTITIONER | Admitting: NURSE PRACTITIONER

## 2023-10-05 ENCOUNTER — Encounter (INDEPENDENT_AMBULATORY_CARE_PROVIDER_SITE_OTHER): Payer: Self-pay | Admitting: NURSE PRACTITIONER

## 2023-10-05 VITALS — Ht 72.0 in

## 2023-10-05 DIAGNOSIS — K1121 Acute sialoadenitis: Secondary | ICD-10-CM | POA: Insufficient documentation

## 2023-10-05 NOTE — Progress Notes (Signed)
 ENT, [facility]  [address]  [place] [address]  Phone: [phone]  Fax: [phone]      Encounter Date: 10/05/2023    Patient ID: Louis Harper  MRN: [id number]    DOB: May 13, 1950  Age: 73 y.o. male     Progress Note       Referring Provider:  Donah Gravely, NP    Reason for Visit:   Chief Complaint   Patient presents with    Follow Up     1 month recheck   LOV 09/04/2023 Acute parotitis         History of Present Illness:  Louis Harper is a 73 y.o. male follow up parotitis.  No further facial pain or swelling      Patient History:  Problem List[1]  Current Outpatient Medications   Medication Sig    allopurinol (ZYLOPRIM) 100 mg Oral Tablet Take 1 Tablet (100 mg total) by mouth Daily    atorvastatin (LIPITOR) 80 mg Oral Tablet Take 1 Tablet (80 mg total) by mouth Every evening    buPROPion (WELLBUTRIN XL) 150 mg extended release 24 hr tablet Take 1 Tablet (150 mg total) by mouth Daily    famotidine (PEPCID) 40 mg Oral Tablet Take 1 Tablet (40 mg total) by mouth Every evening    gabapentin (NEURONTIN) 300 mg Oral Capsule Take 1 Capsule (300 mg total) by mouth Three times a day    hydroCHLOROthiazide (HYDRODIURIL) 12.5 mg Oral Tablet Take 1 Tablet (12.5 mg total) by mouth Once a day    lisinopriL (PRINIVIL) 10 mg Oral Tablet Take 1 Tablet (10 mg total) by mouth Daily    MOUNJARO 10 mg/0.5 mL Subcutaneous Pen Injector Inject 0.5 mL (10 mg total) under the skin Every 7 days    NANO 2ND GEN PEN NEEDLE 32 gauge x 5/32 Does not apply Needle     omeprazole (PRILOSEC) 40 mg Oral Capsule, Delayed Release(E.C.) Take 1 Capsule (40 mg total) by mouth Daily    traZODone (DESYREL) 50 mg Oral Tablet Take 0.5 Tablets (25 mg total) by mouth Every night as needed    TRESIBA FLEXTOUCH U-100 100 unit/mL (3 mL) Subcutaneous Insulin Pen Inject 10 Units under the skin Once a day      Allergies[2]  Past Medical History:   Diagnosis Date    Diabetes mellitus, type 2     History of dizziness     HTN (hypertension)      Hyperlipidemia     Low back pain     Paresthesia of upper limb      Past Surgical History:   Procedure Laterality Date    HX EYE SURGERY      right lazy eye    HX HAND SURGERY      right    HX WRIST FRACTURE TX      right     Family Medical History:    None         Social History[3]    Review of Systems     Vitals:    10/05/23 0918   Height: 1.829 m (6')      ENT Physical Exam  Constitutional  Appearance: patient appears well-developed, well-nourished and well-groomed,  Communication/Voice: communication appropriate for developmental age; vocal quality normal;  Head and Face  Appearance: head appears normal, face appears normal and face appears atraumatic;  Palpation: facial palpation normal;  Salivary: glands normal;  Oral Cavity/Oropharynx  Lips: normal;  Teeth: dentures noted;  Gums:  gingiva normal;  Tongue: normal;  Oral mucosa: normal;  Hard palate: normal;  Neck  Neck: neck normal; neck palpation normal;  Thyroid : thyroid  normal;  Neurovestibular  Mental Status: alert and oriented;  Psychiatric: mood normal; affect is appropriate;  Cranial Nerves: cranial nerves intact;       Assessment:  ENCOUNTER DIAGNOSES     ICD-10-CM   1. Acute parotitis  K11.21       Plan:  Medical records reviewed on 10/05/2023.  Parotid gland tenderness and swelling resolved.  Will follow up if symptoms return    No orders of the defined types were placed in this encounter.    Return if symptoms worsen or fail to improve.    Eleanor Blazer, FNP-BC  10/05/2023, 09:23        [1]   Patient Active Problem List  Diagnosis    Low back pain    Spondylosis of lumbosacral region without myelopathy or radiculopathy    Spinal stenosis, lumbar region, without neurogenic claudication    Paresthesia of upper limb   [2]   Allergies  Allergen Reactions    Amoxicillin-Pot Clavulanate Anaphylaxis    Doxycycline Angioedema   [3]   Social History  Tobacco Use    Smoking status: Former     Current packs/day: 0.00     Types: Cigarettes     Quit date:  05/05/2008     Years since quitting: 15.4    Tobacco comments:     hx 2-3 PPD x 40 years   Substance Use Topics    Alcohol use: No     Alcohol/week: 0.0 standard drinks of alcohol     Comment: quit 2010    Drug use: No

## 2023-10-09 ENCOUNTER — Other Ambulatory Visit: Payer: Self-pay

## 2023-10-09 ENCOUNTER — Other Ambulatory Visit

## 2023-10-09 ENCOUNTER — Ambulatory Visit (INDEPENDENT_AMBULATORY_CARE_PROVIDER_SITE_OTHER)

## 2023-10-09 DIAGNOSIS — E559 Vitamin D deficiency, unspecified: Secondary | ICD-10-CM | POA: Insufficient documentation

## 2023-10-09 DIAGNOSIS — N189 Chronic kidney disease, unspecified: Secondary | ICD-10-CM | POA: Insufficient documentation

## 2023-10-09 LAB — CBC WITH DIFF
BASOPHIL #: 0 x10ˆ3/uL (ref 0.00–0.10)
BASOPHIL %: 1 % (ref 0–1)
EOSINOPHIL #: 0.1 x10ˆ3/uL (ref 0.00–0.60)
EOSINOPHIL %: 2 % (ref 1–8)
HCT: 41.7 % (ref 36.7–47.1)
HGB: 14.4 g/dL (ref 12.5–16.3)
LYMPHOCYTE #: 1.2 x10ˆ3/uL (ref 1.00–3.00)
LYMPHOCYTE %: 20 % (ref 15–43)
MCH: 31.2 pg (ref 23.8–33.4)
MCHC: 34.4 g/dL (ref 32.5–36.3)
MCV: 90.9 fL (ref 73.0–96.2)
MONOCYTE #: 0.5 x10ˆ3/uL (ref 0.30–1.10)
MONOCYTE %: 8 % (ref 6–14)
MPV: 8.7 fL (ref 7.4–11.4)
NEUTROPHIL #: 4.2 x10ˆ3/uL (ref 1.85–7.84)
NEUTROPHIL %: 70 % (ref 44–74)
PLATELETS: 145 x10ˆ3/uL (ref 140–440)
RBC: 4.6 x10ˆ6/uL (ref 4.06–5.63)
RDW: 14.4 % (ref 12.1–16.2)
WBC: 6.1 x10ˆ3/uL (ref 3.6–10.2)

## 2023-10-09 LAB — URIC ACID: URIC ACID: 5.6 mg/dL (ref 2.3–7.6)

## 2023-10-09 LAB — BASIC METABOLIC PANEL
ANION GAP: 5 mmol/L (ref 4–13)
BUN/CREA RATIO: 9 (ref 6–22)
BUN: 13 mg/dL (ref 7–25)
CALCIUM: 8.8 mg/dL (ref 8.6–10.3)
CHLORIDE: 106 mmol/L (ref 98–107)
CO2 TOTAL: 31 mmol/L (ref 21–31)
CREATININE: 1.43 mg/dL — ABNORMAL HIGH (ref 0.60–1.30)
ESTIMATED GFR: 52 mL/min/1.73mˆ2 — ABNORMAL LOW (ref >59)
GLUCOSE: 165 mg/dL — ABNORMAL HIGH (ref 74–109)
OSMOLALITY, CALCULATED: 287 mosm/kg (ref 270–290)
POTASSIUM: 4.2 mmol/L (ref 3.5–5.1)
SODIUM: 142 mmol/L (ref 136–145)

## 2023-10-09 LAB — MAGNESIUM: MAGNESIUM: 1.9 mg/dL (ref 1.9–2.7)

## 2023-10-09 LAB — MICROALBUMIN/CREATININE RATIO, URINE, RANDOM
CREATININE RANDOM URINE: 234 mg/dL — ABNORMAL HIGH (ref 30–125)
MICROALBUMIN RANDOM URINE: 1 mg/dL
MICROALBUMIN/CREATININE RATIO RANDOM URINE: 4.3 mg/g

## 2023-10-09 LAB — VITAMIN D 25 TOTAL: VITAMIN D 25, TOTAL: 31.63 ng/mL (ref 30.00–100.00)

## 2023-10-09 LAB — PROTEIN/CREATININE RATIO, URINE, RANDOM
CREATININE RANDOM URINE: 234 mg/dL — ABNORMAL HIGH (ref 30–125)
PROTEIN RANDOM URINE: 15 mg/dL — ABNORMAL LOW (ref 50–80)
PROTEIN/CREATININE RATIO RANDOM URINE: 0.064 mg/mg (ref 0.000–200.000)

## 2023-10-09 LAB — PARATHYROID HORMONE (PTH): PTH: 53.8 pg/mL (ref 12.0–88.0)

## 2023-10-16 ENCOUNTER — Other Ambulatory Visit: Payer: Self-pay

## 2023-10-16 ENCOUNTER — Ambulatory Visit (INDEPENDENT_AMBULATORY_CARE_PROVIDER_SITE_OTHER): Admitting: Nephrology

## 2023-10-16 ENCOUNTER — Encounter (INDEPENDENT_AMBULATORY_CARE_PROVIDER_SITE_OTHER): Payer: Self-pay | Admitting: Nephrology

## 2023-10-16 VITALS — BP 121/72 | HR 78 | Ht 72.0 in | Wt 237.0 lb

## 2023-10-16 DIAGNOSIS — Z7985 Long-term (current) use of injectable non-insulin antidiabetic drugs: Secondary | ICD-10-CM

## 2023-10-16 DIAGNOSIS — Z794 Long term (current) use of insulin: Secondary | ICD-10-CM

## 2023-10-16 DIAGNOSIS — N1831 Chronic kidney disease, stage 3a: Secondary | ICD-10-CM

## 2023-10-16 DIAGNOSIS — E1122 Type 2 diabetes mellitus with diabetic chronic kidney disease: Secondary | ICD-10-CM

## 2023-10-16 DIAGNOSIS — D631 Anemia in chronic kidney disease: Secondary | ICD-10-CM

## 2023-10-16 DIAGNOSIS — E559 Vitamin D deficiency, unspecified: Secondary | ICD-10-CM

## 2023-10-16 DIAGNOSIS — I129 Hypertensive chronic kidney disease with stage 1 through stage 4 chronic kidney disease, or unspecified chronic kidney disease: Secondary | ICD-10-CM

## 2023-10-16 NOTE — Progress Notes (Signed)
 NEPHROLOGY, [facility]  [address]  [place] [address]    Progress Note    Name: Louis Harper MRN:  [id number]   Date: 10/16/2023 DOB:  [birth year] (73 y.o.)              Nephrology Out  Patient Visit         HPI : 73 y.o.  male here to establish care.Chronic kidney disease:  Creatinine 1.54 GFR 47 stage IIIA.  , past medical history chronic kidney disease, .      Patient is feeling well.  No nausea no vomiting.  No diarrhea.  No edema.  Patient remains on Mounjaro.  Patient does not drink alcohol.  No smoking.    Family history:  No known family history kidney disease social history: Denies history alcohol tobacco illicit drug use.  Lab work from other lab:  June 07, 2023 creatinine 1.54 GFR 47 sodium 141 potassium 4.2 carbon dioxide 22 calcium 9.3 hemoglobin 15.9 iron saturation 23 uric acid 4.9 March 16, 2023 creatinine 1.55 GFR 47 December 14, 2022 creatinine 1.88 GFR 37      ROS:     Assessment was negative except what mentioned in in the HPI.     OBJECTIVE:   BP 121/72   Pulse 78   Ht 1.829 m (6')   Wt 108 kg (237 lb)   BMI 32.14 kg/m       General:  NAD, AAOx3  HEENT:  EOMI,  no icterus  NECK: No increased JVD.  Normal inspection   HEART:   Regular rhythm. No murmurs or rubs. No chest wall tenderness   LUNGS: Clear to auscultation bilateral. No wheezes, rales, or rhonchi.   ABDOMEN: +BS, Soft, nontender and nondistended. No rebound or guarding present.   EXTREMITIES: No edema. No cyanosis or clubbing.No asterixis    NEURO : Normal speech, EOMI.       LABORATORY DATA:   Lab Results   Component Value Date    BUN 13 10/09/2023    CREATININE 1.43 (H) 10/09/2023    BUNCRRATIO 9 10/09/2023    GFR 52 (L) 10/09/2023    SODIUM 142 10/09/2023    POTASSIUM 4.2 10/09/2023    CHLORIDE 106 10/09/2023    CO2 31 10/09/2023    ANIONGAP 5 10/09/2023    CALCIUM 8.8 10/09/2023    ALBUMIN 4.0 11/23/2022    ALBUMIN 4.0 05/11/2022    HGB 14.4 10/09/2023    HGB 13.8 11/23/2022    HGB 13.2 05/11/2022     HCT 41.7 10/09/2023    HCT 42.9 11/23/2022    HCT 40.2 05/11/2022    INTACTPTH 53.8 10/09/2023           MEDICATIONS:  No outpatient medications have been marked as taking for the 10/16/23 encounter (Office Visit) with Helene Meter, MD.     Current Outpatient Medications   Medication Instructions    allopurinoL (ZYLOPRIM) 100 mg, Daily    atorvastatin (LIPITOR) 80 mg, EVERY EVENING    buPROPion (WELLBUTRIN XL) 150 mg, Daily    famotidine (PEPCID) 40 mg, EVERY EVENING    gabapentin (NEURONTIN) 300 mg, 3 TIMES DAILY    hydroCHLOROthiazide (HYDRODIURIL) 12.5 mg, DAILY    lisinopriL (PRINIVIL) 10 mg, Daily    Mounjaro 10 mg, EVERY 7 DAYS    NANO 2ND GEN PEN NEEDLE 32 gauge x 5/32 Does not apply Needle     omeprazole (PRILOSEC) 40 mg, Daily    traZODone (DESYREL)  25 mg, NIGHTLY PRN    [place] FlexTouch U-100 10 Units, DAILY      ASSESSMENT / PLAN:  ENCOUNTER DIAGNOSES     ICD-10-CM   1. 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 Vitamin D  deficiency  E55.9           Chronic kidney disease  -Stage III a  -Due to diabetes and hypertension  -Creatinine is 1.43 GFR 52  -Baseline creatinine 1.4-1.5  -Total protein to creatinine ratio-0.06  -UA CR 4  -CBC and a basic metabolic panel  -Return to clinic in 4 months  -Continue low-sodium diet  -balanced Fluid intake 40-50 oz a day.    -Avoid NSAIDs.  Tylenol only for pain  -ACEI or ARBS:  No  -Sodium Glucose Cotransporter-2 (SGLT2) Inhibitors:  No  -Mounjaro  -HCTZ    CKD bone mineral disease  Lab Results   Component Value Date    INTACTPTH 53.8 10/09/2023    VITAMIND25 31.63 10/09/2023        Hypertension  -Blood pressure is acceptable  -Goal less than 130/80  -Continue no.    Diabetes type 2  -A1c goal is less than 7   -Continue Mounjaro   -Diabetic diet  -Increase activity and exercise as possible  -Monitor A1C    Anemia of CKD  - no  - HB   Lab Results   Component Value Date    HGB 14.4 10/09/2023       -continue to monitor        Orders Placed This Encounter     BASIC METABOLIC PANEL    CBC/DIFF    MAGNESIUM    MICROALBUMIN/CREATININE RATIO, URINE, RANDOM    PARATHYROID  HORMONE (PTH)    PROTEIN/CREATININE RATIO, URINE, RANDOM    URIC ACID    VITAMIN D  25 TOTAL

## 2024-01-31 ENCOUNTER — Other Ambulatory Visit (INDEPENDENT_AMBULATORY_CARE_PROVIDER_SITE_OTHER): Payer: Self-pay

## 2024-02-07 ENCOUNTER — Encounter (INDEPENDENT_AMBULATORY_CARE_PROVIDER_SITE_OTHER): Payer: Self-pay | Admitting: Nephrology

## 2024-03-25 ENCOUNTER — Ambulatory Visit (INDEPENDENT_AMBULATORY_CARE_PROVIDER_SITE_OTHER): Payer: Self-pay

## 2024-03-25 ENCOUNTER — Other Ambulatory Visit: Payer: Self-pay

## 2024-03-25 ENCOUNTER — Other Ambulatory Visit: Attending: Nephrology | Admitting: Nephrology

## 2024-03-25 DIAGNOSIS — E559 Vitamin D deficiency, unspecified: Secondary | ICD-10-CM | POA: Insufficient documentation

## 2024-03-25 DIAGNOSIS — N1831 Chronic kidney disease, stage 3a: Secondary | ICD-10-CM

## 2024-03-25 LAB — MICROALBUMIN/CREATININE RATIO, URINE, RANDOM
CREATININE RANDOM URINE: 170 mg/dL — ABNORMAL HIGH (ref 30–125)
MICROALBUMIN RANDOM URINE: <0.7 mg/dL

## 2024-03-25 LAB — CBC WITH DIFF
BASOPHIL #: 0 10*3/uL (ref 0.00–0.10)
BASOPHIL %: 1 % (ref 0–1)
EOSINOPHIL #: 0.2 10*3/uL (ref 0.00–0.60)
EOSINOPHIL %: 3 % (ref 1–8)
HCT: 44.1 % (ref 36.7–47.1)
HGB: 15 g/dL (ref 12.5–16.3)
LYMPHOCYTE #: 1.2 10*3/uL (ref 1.00–3.00)
LYMPHOCYTE %: 20 % (ref 15–43)
MCH: 30.4 pg (ref 23.8–33.4)
MCHC: 33.9 g/dL (ref 32.5–36.3)
MCV: 89.7 fL (ref 73.0–96.2)
MONOCYTE #: 0.5 10*3/uL (ref 0.30–1.10)
MONOCYTE %: 9 % (ref 6–14)
MPV: 8.9 fL (ref 7.4–11.4)
NEUTROPHIL #: 4.1 10*3/uL (ref 1.85–7.84)
NEUTROPHIL %: 68 % (ref 44–74)
PLATELETS: 155 10*3/uL (ref 140–440)
RBC: 4.92 10*6/uL (ref 4.06–5.63)
RDW: 14.5 % (ref 12.1–16.2)
WBC: 6 10*3/uL (ref 3.6–10.2)

## 2024-03-25 LAB — BASIC METABOLIC PANEL
ANION GAP: 7 mmol/L (ref 4–13)
BUN/CREA RATIO: 14 (ref 6–22)
BUN: 20 mg/dL (ref 7–25)
CALCIUM: 8.9 mg/dL (ref 8.6–10.3)
CHLORIDE: 107 mmol/L (ref 98–107)
CO2 TOTAL: 29 mmol/L (ref 21–31)
CREATININE: 1.42 mg/dL — ABNORMAL HIGH (ref 0.60–1.30)
ESTIMATED GFR: 52 mL/min/{1.73_m2} — ABNORMAL LOW (ref >59)
GLUCOSE: 183 mg/dL — ABNORMAL HIGH (ref 74–109)
OSMOLALITY, CALCULATED: 292 mosm/kg — ABNORMAL HIGH (ref 270–290)
POTASSIUM: 4.1 mmol/L (ref 3.5–5.1)
SODIUM: 143 mmol/L (ref 136–145)

## 2024-03-25 LAB — MAGNESIUM: MAGNESIUM: 2 mg/dL (ref 1.9–2.7)

## 2024-03-25 LAB — PROTEIN/CREATININE RATIO, URINE, RANDOM
CREATININE RANDOM URINE: 170 mg/dL — ABNORMAL HIGH (ref 30–125)
PROTEIN RANDOM URINE: 9 mg/dL — ABNORMAL LOW (ref 50–80)
PROTEIN/CREATININE RATIO RANDOM URINE: 0.053 mg/mg (ref 0.000–200.000)

## 2024-03-25 LAB — VITAMIN D 25 TOTAL: VITAMIN D 25, TOTAL: 23.66 ng/mL — ABNORMAL LOW (ref 30.00–100.00)

## 2024-03-25 LAB — URIC ACID: URIC ACID: 5.9 mg/dL (ref 2.3–7.6)

## 2024-03-25 LAB — PARATHYROID HORMONE (PTH): PTH: 70.6 pg/mL (ref 12.0–88.0)

## 2024-04-01 ENCOUNTER — Encounter (INDEPENDENT_AMBULATORY_CARE_PROVIDER_SITE_OTHER): Admitting: Nephrology
